# Patient Record
Sex: Male | Born: 1945 | Race: White | Hispanic: No | Marital: Single | Smoking: Former smoker
Health system: Southern US, Community
[De-identification: ages and names within clinical notes are randomized; demographics above are authoritative.]

## PROBLEM LIST (undated history)

## (undated) DIAGNOSIS — E538 Deficiency of other specified B group vitamins: Secondary | ICD-10-CM

## (undated) DIAGNOSIS — K219 Gastro-esophageal reflux disease without esophagitis: Secondary | ICD-10-CM

## (undated) DIAGNOSIS — R112 Nausea with vomiting, unspecified: Secondary | ICD-10-CM

## (undated) DIAGNOSIS — E559 Vitamin D deficiency, unspecified: Secondary | ICD-10-CM

## (undated) DIAGNOSIS — M720 Palmar fascial fibromatosis [Dupuytren]: Secondary | ICD-10-CM

## (undated) DIAGNOSIS — I10 Essential (primary) hypertension: Secondary | ICD-10-CM

## (undated) DIAGNOSIS — E785 Hyperlipidemia, unspecified: Secondary | ICD-10-CM

## (undated) DIAGNOSIS — R748 Abnormal levels of other serum enzymes: Secondary | ICD-10-CM

## (undated) DIAGNOSIS — R002 Palpitations: Secondary | ICD-10-CM

## (undated) DIAGNOSIS — E119 Type 2 diabetes mellitus without complications: Secondary | ICD-10-CM

## (undated) DIAGNOSIS — N400 Enlarged prostate without lower urinary tract symptoms: Secondary | ICD-10-CM

## (undated) DIAGNOSIS — Z9889 Other specified postprocedural states: Secondary | ICD-10-CM

## (undated) DIAGNOSIS — I491 Atrial premature depolarization: Secondary | ICD-10-CM

## (undated) HISTORY — PX: REVISION TOTAL KNEE ARTHROPLASTY: SUR1280

## (undated) HISTORY — PX: TOTAL KNEE ARTHROPLASTY: SHX125

## (undated) HISTORY — PX: CHOLECYSTECTOMY: SHX55

## (undated) HISTORY — PX: INGUINAL HERNIA REPAIR: SUR1180

## (undated) HISTORY — PX: SKIN LESION EXCISION: SHX2412

---

## 2004-09-03 ENCOUNTER — Inpatient Hospital Stay (HOSPITAL_COMMUNITY): Admission: RE | Admit: 2004-09-03 | Discharge: 2004-09-06 | Payer: Self-pay | Admitting: Orthopedic Surgery

## 2004-10-24 ENCOUNTER — Ambulatory Visit (HOSPITAL_BASED_OUTPATIENT_CLINIC_OR_DEPARTMENT_OTHER): Admission: RE | Admit: 2004-10-24 | Discharge: 2004-10-24 | Payer: Self-pay | Admitting: Orthopedic Surgery

## 2004-10-24 ENCOUNTER — Ambulatory Visit (HOSPITAL_COMMUNITY): Admission: RE | Admit: 2004-10-24 | Discharge: 2004-10-24 | Payer: Self-pay | Admitting: Orthopedic Surgery

## 2009-03-16 ENCOUNTER — Ambulatory Visit (HOSPITAL_COMMUNITY): Admission: RE | Admit: 2009-03-16 | Discharge: 2009-03-16 | Payer: Self-pay | Admitting: Orthopedic Surgery

## 2010-03-03 ENCOUNTER — Encounter: Payer: Self-pay | Admitting: Orthopedic Surgery

## 2010-06-28 NOTE — Discharge Summary (Signed)
Nicholas Quinn, Nicholas Quinn            ACCOUNT NO.:  [id number]   MEDICAL RECORD NO.:  [id number]          PATIENT TYPE:  INP   LOCATION:  5018                         FACILITY:  [facility]   PHYSICIAN:  Doralee Albino. Carola Frost, M.D. 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   DISCHARGE DIAGNOSIS:  Status post left total knee arthroscopy.   CONSULTATIONS:  None.   PROCEDURE:  The procedure performed on September 03, 2004, is a left total knee  arthroplasty performed by Dr. Doralee Albino. Handy and Dr. Elana Alm. Wainer  without complications.   HISTORY OF PRESENT ILLNESS:  This is a 65 year old white male with a  longstanding history of severe left knee degenerative joint disease who  wishes to proceed with a left knee arthroplasty to relieve his pain and  deformity at this time.   PHYSICAL EXAMINATION:  GENERAL:  A well-appearing, well-developed 59-year-  old white male, in no acute distress.  HEENT:  Pupils equal and reactive to light and accommodation.  Extraocular  movements intact.  Head normocephalic and atraumatic.  NECK:  Supple.  CHEST:  Lungs are clear to auscultation bilaterally.  HEART:  A regular rate and rhythm.  ABDOMEN:  Positive bowel sounds, nontender and soft.  SKIN:  Warm and dry without rashes.  EXTREMITIES:  Distal neurovascular exam is intact bilaterally in the lower  and upper extremities.  He does have some severe varus deformity.  On x-rays  there is severe left knee degenerative joint disease with a varus deformity.   LABORATORY DATA:  On admission white blood cells 7.9, hemoglobin 15.3,  hematocrit 44.9.  Sodium 135, potassium 3.8, chloride 106, CO2 of 25,  glucose 121, BUN 10, creatinine 0.8.   HOSPITAL COURSE:  On September 03, 2004, the patient underwent a left knee  arthroplasty without complications by Dr. Thurston Hole and Dr. Carola Frost.  On September 04, 2004, on the following day, the patient is seen.  The H&H is 12.9 and 36.7.  Other labs are stable.  At this time the patient was put on Coumadin per  pharmacy protocol to help prevent any deep venous thrombosis.  At this time  the patient is lying in bed, in no acute distress.  He is afebrile.  His  vital signs are stable.  His calves are soft and nontender.  His  neurovascular exam remains intact.   The patient will be weightbearing as tolerated.  We are hoping to increase  his range of motion in his left knee.  On September 05, 2004, the patient is  seen.  Once again the patient's chief complaint is nausea.  The pain is well-  controlled.  The T-Max of 100.1 degrees the previous night.  The incision is  well-approximated without erythema.  His dressing did have some  serosanguineous fluid on it.  The calves remain soft and nontender.  The  dressing is replaced.  Today we will discontinue the  PCA and the Foley.   DISPOSITION:  We will plan for discharge in the morning.  On September 06, 2004,  the patient is seen once again.  The patient reports that the nausea is much  better.  It has subsequently gone.  Labs remain stable.  The patient has no  questions or concerns at this time and would like to be discharged to home.  We are happy to accommodate him in this endeavor.   FOLLOWUP:  He is to follow up with Dr. Carola Frost on Wednesday.  Please call [phone] for an appointment.   DISCHARGE INSTRUCTIONS:  He will be given Percocet for pain, Robaxin for  muscle spasms, Phenergan for nausea, Coumadin for deep venous thrombosis  prophylaxis, and he is given a prescription for a 3 in 1 commode to assist  him in his home.  The patient is instructed to keep his left knee incision  clean and dry at this time.       RWC/MEDQ  D:  09/06/2004  T:  09/06/2004  Job:  [id number]

## 2010-06-28 NOTE — Op Note (Signed)
NAME:  LAQUINTON, BIHM            ACCOUNT NO.:  [id number]   MEDICAL RECORD NO.:  [id number]          PATIENT TYPE:  AMB   LOCATION:  DSC                          FACILITY:  [facility]   PHYSICIAN:  Loreta Ave, M.D. 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   OPERATIVE PROCEDURE:  Left knee exam and manipulation under anesthesia,  intraarticular injection.   SURGEON:  Loreta Ave, M.D.   ANESTHESIA:  General.   PROCEDURE:  The patient was brought to the operating room, and after I had  adequate muscle relaxation and anesthesia, the left knee was examined.  Relatively firm 10-12 degree flexion contracture.  Further flexion to 90  degrees.  Some decreased patellofemoral mobility, but good alignment and  stability.  He was gently manipulated to gain both flexion and extension.  With capsular stretching and breaking of adhesions without adverse  occurrence, I was able to achieve full extension and flexion to 120 degrees.  Maintained good stability and alignment.  I felt this was enough improvement  that intervention arthroscopically or open was not indicated.  Under sterile  technique, he was injected intraarticularly with Marcaine after I aspirated  clear fluid.  Knee immobilizer applied.  Anesthesia reversed.  Brought to  recovery room.  Tolerated surgery well.  No complications.      Loreta Ave, M.D.  Electronically Signed     DFM/MEDQ  D:  10/24/2004  T:  10/24/2004  Job:  [id number]

## 2010-06-28 NOTE — Op Note (Signed)
NAMEUZIAH, SORTER            ACCOUNT NO.:  [id number]   MEDICAL RECORD NO.:  [id number]          PATIENT TYPE:  INP   LOCATION:  2550                         FACILITY:  [facility]   PHYSICIAN:  Doralee Albino. Carola Frost, M.D. DATE OF BIRTH:  Sep 28, 1945   DATE OF PROCEDURE:  09/03/2004  DATE OF DISCHARGE:                                 OPERATIVE REPORT   PREOPERATIVE DIAGNOSIS:  Left knee end-stage degenerative arthritis.   POSTOPERATIVE DIAGNOSIS:  Left knee end-stage degenerative arthritis.   PROCEDURE:  Left total knee arthroplasty using DePuy RPS size 4 femur, #5  tibia, 38-mm patella and 10 mm insert.   SURGEON:  Doralee Albino. Carola Frost, M.D.   ASSISTANTS:  1.  Elana Alm. Thurston Hole, M.D.  2.  Cecil Cranker, PA.   ANESTHESIA:  General supplemented with femoral nerve block.   DRAINS:  Two.   TOTAL TOURNIQUET TIME:  Approximately 1 hour 15 minutes.   EBL: 100cc   DISPOSITION:  To PACU.   CONDITION:  Stable.   BRIEF SUMMARY OF INDICATION FOR PROCEDURE:  Hicks Feick is a 65-year-  old white male who has had a longstanding history of left knee pain and  deformity which has been progressive and refractory to conservative measures  including therapy, injections and arthroscopic debridement. After thorough  discussion of risks and benefits of surgery, he wished to proceed.   BRIEF DESCRIPTION OF PROCEDURE:  Mr. Colee was administered preoperative  antibiotics, taken to the operating room were general anesthesia was induced  and he was positioned supine with his left lower extremity being prepped in  the usual sterile fashion after application of a tourniquet about the thigh.  Following exsanguination of the extremity with an Esmarch bandage, the  tourniquet was inflated to 300 mmHg.  A standard midline incision was made  and dissection carried down to the quadriceps tendon.  The tendinous portion  of the quadriceps was clearly identified and then with a fresh blade,  incised using a typical medial parapatellar arthrotomy. The medial aspect of  the proximal tibia was then elevated with the Bovie.  A partial excision was  then performed of the fat pad and the patella everted without difficulty.  Some a large osteophytes were removed with the rongeur and then the knee was  brought into flexion. The patella, femoral and tibial surfaces were  essentially completely eburnated bone.  The medial compartment had sclerotic  bone and significant wear. Osteophytes were removed from the femur. The ACL  debrided and the distal femoral cutting block placed after partial  synovectomy to clearly visualize the anterior surface of the distal femur.  It was sized to a #4 femur and a 13 mm distal cut made because of the  extensive flexion contracture the cutting block was then placed in the shaft  and posterior and anterior cuts made. Further debridement was performed.  We  were careful to protect the collaterals at all time with  so called funky  retractors. The remaining medial meniscus was then debrided.  The attentions  were then turned to the tibia where  a proximal tibia cut was made  using the  intramedullary guide. We also used the alignment rod to check for  appropriate varus valgus alignment. It lined with the second ray.  The  proximal cut was made and finished under direct visualization posteriorly.   The flexion gap and extension gaps were then assessed and both noted to be  too tight to accommodate the smallest poly. Consequently, the tibia cut was  refined and four additional millimeters of bone resected.  The flexion and  extension gaps were then well-balanced and once again the alignment rod  showed appropriate and symmetric cuts. The soft tissues were balanced as  well, using a partial release of the medial collateral and also external  rotation of the femoral component as well as debridement of the posterior  aspect of the capsule with removal of  osteophytes and partial elevation of  the capsule along the posterior medial aspect of the femur. The tibia was  then trialed and #5 tray shown to have the most coverage.  The proximal  tibia was repaired with intramedullary reamer and wing impactor.  The trial  components consisting of #4 femur, 10 mm poly and #5 tibia were then  inserted.  The knee was noted to be stable in full extension to varus and  valgus stress. It came easily into full extension. The trial components were  then removed. Pulsatile lavage used to wash the then the knee brought into  flexion where the tibial component was inserted, cemented into place.  The  femoral component was inserted and then finally the tibial insert. Axial  load was performed. Additional cement removed.  Lavage was performed once  more and after the cement cured, additional small particles were removed.  The patella was cut using the patellar jig with resection of 9 mm of bone.  It was sized to a 38 which was placed along the medial aspect of the patella  and cemented held clamped until hardening of the cement. The patella was  allowed to return to its proper position and it tracked very easily without  the need for any formal lateral release. Once again the knee was examined  and found be stable and then the tourniquet deflated where hemostasis was  obtained with electrocautery. The arthrotomy was then closed with figure-of-  eight Ethibond suture, subcu with 2-0 Vicryl and the skin with staples.  Sterile gently compressive dressing and knee immobilizer were applied. The  patient awakened from anesthesia and taken to PACU in stable condition.   PROGNOSIS:  Mr. Meunier will be weightbearing as tolerated. He will be  placed into a CPM device at today with increasing flexion as tolerated. He  will require thromboprophylaxis with Coumadin for the next six weeks.       MHH/MEDQ  D:  09/03/2004  T:  09/03/2004  Job:  [id number]

## 2010-10-31 ENCOUNTER — Encounter (HOSPITAL_COMMUNITY)
Admission: RE | Admit: 2010-10-31 | Discharge: 2010-10-31 | Disposition: A | Discharge status: Home / Self Care | Payer: Medicare Other | Source: Ambulatory Visit | Attending: Orthopedic Surgery | Admitting: Orthopedic Surgery

## 2010-10-31 ENCOUNTER — Other Ambulatory Visit (HOSPITAL_COMMUNITY): Payer: Self-pay | Admitting: Orthopedic Surgery

## 2010-10-31 DIAGNOSIS — Z96659 Presence of unspecified artificial knee joint: Secondary | ICD-10-CM

## 2010-10-31 DIAGNOSIS — T84038A Mechanical loosening of other internal prosthetic joint, initial encounter: Secondary | ICD-10-CM

## 2010-10-31 LAB — URINALYSIS, ROUTINE W REFLEX MICROSCOPIC
Glucose, UA: NEGATIVE mg/dL
Hgb urine dipstick: NEGATIVE
Leukocytes, UA: NEGATIVE
Protein, ur: NEGATIVE mg/dL
Specific Gravity, Urine: 1.018 (ref 1.005–1.030)
Urobilinogen, UA: 1 mg/dL (ref 0.0–1.0)

## 2010-10-31 LAB — BASIC METABOLIC PANEL
Calcium: 10 mg/dL (ref 8.4–10.5)
GFR calc Af Amer: >60 mL/min (ref >60)
GFR calc non Af Amer: >60 mL/min (ref >60)
Potassium: 4.6 mEq/L (ref 3.5–5.1)
Sodium: 141 mEq/L (ref 135–145)

## 2010-10-31 LAB — DIFFERENTIAL
Basophils Relative: 1 % (ref 0–1)
Eosinophils Absolute: 0.4 10*3/uL (ref 0.0–0.7)
Lymphs Abs: 2 10*3/uL (ref 0.7–4.0)
Neutro Abs: 6.7 10*3/uL (ref 1.7–7.7)
Neutrophils Relative %: 65 % (ref 43–77)

## 2010-10-31 LAB — CBC
Hemoglobin: 15.7 g/dL (ref 13.0–17.0)
Platelets: 253 10*3/uL (ref 150–400)
RBC: 5.02 MIL/uL (ref 4.22–5.81)
WBC: 10.3 10*3/uL (ref 4.0–10.5)

## 2010-10-31 LAB — APTT: aPTT: 28 seconds (ref 24–37)

## 2010-10-31 LAB — SURGICAL PCR SCREEN
MRSA, PCR: NEGATIVE
Staphylococcus aureus: NEGATIVE

## 2010-10-31 LAB — PROTIME-INR: Prothrombin Time: 13.5 seconds (ref 11.6–15.2)

## 2010-11-11 ENCOUNTER — Inpatient Hospital Stay (HOSPITAL_COMMUNITY): Payer: Medicare Other

## 2010-11-11 ENCOUNTER — Inpatient Hospital Stay (HOSPITAL_COMMUNITY)
Admission: RE | Admit: 2010-11-11 | Discharge: 2010-11-14 | DRG: 468 | Disposition: A | Discharge status: Skilled Nursing Facility | Payer: Medicare Other | Source: Ambulatory Visit | Attending: Orthopedic Surgery | Admitting: Orthopedic Surgery

## 2010-11-11 DIAGNOSIS — I10 Essential (primary) hypertension: Secondary | ICD-10-CM | POA: Diagnosis present

## 2010-11-11 DIAGNOSIS — Z23 Encounter for immunization: Secondary | ICD-10-CM

## 2010-11-11 DIAGNOSIS — Z01818 Encounter for other preprocedural examination: Secondary | ICD-10-CM

## 2010-11-11 DIAGNOSIS — R112 Nausea with vomiting, unspecified: Secondary | ICD-10-CM | POA: Diagnosis present

## 2010-11-11 DIAGNOSIS — K219 Gastro-esophageal reflux disease without esophagitis: Secondary | ICD-10-CM | POA: Diagnosis present

## 2010-11-11 DIAGNOSIS — E7800 Pure hypercholesterolemia, unspecified: Secondary | ICD-10-CM | POA: Diagnosis present

## 2010-11-11 DIAGNOSIS — M129 Arthropathy, unspecified: Secondary | ICD-10-CM | POA: Diagnosis present

## 2010-11-11 DIAGNOSIS — Z79899 Other long term (current) drug therapy: Secondary | ICD-10-CM

## 2010-11-11 DIAGNOSIS — Y831 Surgical operation with implant of artificial internal device as the cause of abnormal reaction of the patient, or of later complication, without mention of misadventure at the time of the procedure: Secondary | ICD-10-CM | POA: Diagnosis present

## 2010-11-11 DIAGNOSIS — T84498A Other mechanical complication of other internal orthopedic devices, implants and grafts, initial encounter: Principal | ICD-10-CM | POA: Diagnosis present

## 2010-11-11 DIAGNOSIS — Z01812 Encounter for preprocedural laboratory examination: Secondary | ICD-10-CM

## 2010-11-11 DIAGNOSIS — Z01810 Encounter for preprocedural cardiovascular examination: Secondary | ICD-10-CM

## 2010-11-11 LAB — TYPE AND SCREEN: ABO/RH(D): A POS

## 2010-11-11 LAB — GRAM STAIN

## 2010-11-12 ENCOUNTER — Inpatient Hospital Stay (HOSPITAL_COMMUNITY): Payer: Medicare Other

## 2010-11-12 LAB — BODY FLUID CULTURE

## 2010-11-12 LAB — BASIC METABOLIC PANEL
BUN: 14 mg/dL (ref 6–23)
Creatinine, Ser: 0.8 mg/dL (ref 0.50–1.35)
GFR calc non Af Amer: >90 mL/min (ref >90)
Glucose, Bld: 195 mg/dL — ABNORMAL HIGH (ref 70–99)
Potassium: 4.2 mEq/L (ref 3.5–5.1)

## 2010-11-12 LAB — CBC
MCH: 30.4 pg (ref 26.0–34.0)
MCHC: 34.7 g/dL (ref 30.0–36.0)
MCV: 87.5 fL (ref 78.0–100.0)
Platelets: 202 10*3/uL (ref 150–400)
RBC: 3.36 MIL/uL — ABNORMAL LOW (ref 4.22–5.81)

## 2010-11-13 LAB — CBC
HCT: 27 % — ABNORMAL LOW (ref 39.0–52.0)
Hemoglobin: 9.2 g/dL — ABNORMAL LOW (ref 13.0–17.0)
MCH: 30.4 pg (ref 26.0–34.0)
MCHC: 34.1 g/dL (ref 30.0–36.0)

## 2010-11-13 LAB — PROTIME-INR: Prothrombin Time: 19.8 seconds — ABNORMAL HIGH (ref 11.6–15.2)

## 2010-11-14 LAB — CBC
Platelets: 171 10*3/uL (ref 150–400)
RBC: 3.08 MIL/uL — ABNORMAL LOW (ref 4.22–5.81)
WBC: 10.8 10*3/uL — ABNORMAL HIGH (ref 4.0–10.5)

## 2010-11-14 LAB — PROTIME-INR: Prothrombin Time: 20.2 seconds — ABNORMAL HIGH (ref 11.6–15.2)

## 2010-11-16 LAB — ANAEROBIC CULTURE

## 2010-11-16 NOTE — Op Note (Signed)
NAMEMONTERRIO, GERST NO.:  [id number]  MEDICAL RECORD NO.:  [id number]  LOCATION:  5013                         FACILITY:  [facility]  PHYSICIAN:  Feliberto Gottron. Turner Daniels, M.D.   DATE OF BIRTH:  01/21/1946  DATE OF PROCEDURE:  11/11/2010 DATE OF DISCHARGE:                              OPERATIVE REPORT   PREOPERATIVE DIAGNOSIS:  Loose left total knee.  POSTOPERATIVE DIAGNOSIS:  Loose left total knee.  PROCEDURE:  Revision left total knee arthroplasty with removal of a DePuy sigma RP design with a 4 femur and a 5 tibia and revision to a 5 femur stems with a 31-mm cone and a 14 x 75 stem on the tibial side, a 6 six tibia with a 12 x 75 stem.  We used a 12 mm MBT bearing #5.  SURGEON:  Feliberto Gottron.  Turner Daniels, MD  FIRST ASSISTANT:  Shirl Harris, PA-C  ANESTHETIC:  General endotracheal.  ESTIMATED BLOOD LOSS:  600 mL.  FLUID REPLACEMENT:  1500 mL of crystalloid.  DRAINS PLACED:  Foley catheter, 2 medium Hemovac.  TOURNIQUET TIME:  45 minutes.  INDICATIONS FOR PROCEDURE:  A 65 year old gentleman who had fractures of his left lower extremity back in 2007, underwent a primary total knee after the fractures healed using DePuy sigma RP design.  He has had progressive loosening over the last year or so confirmed by bone scan. He has had sterile effusions that grew out nothing on aspiration.  On the lateral of the femoral component, you can clearly see where the metal is being laminated off the cement and stepped forward and on the tibial side there are large cysts beneath the tibial component with either loosening or near loosening.  The patellar component appears to be well-fixed.  In any event because of severe unremitting pain, it is progressive over the last year or so.  He desires elective revision of his loose left total knee arthroplasty.  Risks and benefits of surgery have been discussed, questions answered.  DESCRIPTION OF PROCEDURE:  The patient identified by  armband, received preoperative IV antibiotics in the holding area at [facility], taken to operating room #5, appropriate anesthetic monitors were attached and endotracheal anesthesia was induced with the patient supine position.  Foley catheter was inserted lateral post and foot positioner applied to the table.  Left lower extremity prepped and draped in usual sterile fashion from the ankle to the midthigh.  A tourniquet was placed high on the midthigh prior to the prep and drape.  Time-out procedure was performed and we began the operation with the tourniquet down.  We recreated the old anterior midline incision starting a handbreadth above the patella, going over the patella, and 1 cm medial to and 4 cm distal to the tibial tubercle.  Small bleeders and skin and subcutaneous tissue identified and cauterized.  Transverse retinaculum identified and reflected medially exposing the line of Ethibond sutures that were used for the original closure.  We cut through this line.  There was a fair amount of heterotopic bone that we had to work around and a medial parapatellar arthrotomy was accomplished.  Clear joint fluid was expressed.  A bit of synovium was sent to  the lab for Gram stain and culture and came back with occasional monos, no polys, no organisms.  We then sent about removing large amounts of exuberant synovium.  There was some obvious bits of cement in the synovium.  We sequentially worked around the superficial medial collateral ligament elevating it from anterior to posterior off the proximal tibia allowing [place] to externally rotate the tibia as we flexed it.  Scar tissue was also removed from around the patellar tendon medially and laterally and we continued external rotation and subluxed the patella laterally.  As we began to work our way around, we were able to spin the bearing out and remove it. At this point, it was obvious that the femoral component was grossly loose  and pretty much came off with hand pressure.  There were large defects in the medial femoral condyle and these were debrided.  A fibrous tissue back down to bone did appear to be healthy.  On the lateral side, there was actually distal cement that was still well-fixed to the bone and this was left alone to set leg length.  At this point, we were able to evert the patella with the knee hyperflexed and worked our way around the tibial component, broke down the interface between the bone and cement with a small ACL saw and removed the tibial component without difficulty.  There was a large cyst underneath the medial side and scar tissue and fibrous tissue was removed from this as well.  The cement plug from the primary procedure was also removed using the [facility] instruments.  At this point, we were able to instrument the proximal tibia, we decided to use a 75-mm stem and sequentially reamed up to a 12-mm reamer to the appropriate depth for a 75 stem.  We then set the cutting guide in place with pins and removed about 1 mm of bone and cement from the tibia that only had the cavitary defect on the medial tibial plateau and otherwise the rim was in pretty good shape. We continued to remove scar tissue from the posterior condyles of the femur at this time as well.  We then directed our attention to the femur and removed cement from the notch region and instrumented the femur with the reamers up to a 14-mm reamer to the appropriate depth for a small cone and the 75 stem.  The broach was then inserted after first removing sclerotic bone with a bur to the appropriate depth for a 14 mm x 75 stem and a 31-mm femoral cone.  This was set at the same depth as the distal femur on the lateral side which had a normal appearance.  With the broach in place, we then checked to see if we needed to make any more bony cuts anteriorly or posteriorly by setting the cutting guide in place in the same rotation as  the epicondylar axis and no bony cut was needed, same with the chamfer cutting guide.  We did cut the notch.  We then directed our attention back to the tibia, sized for a #6 tibial baseplate.  We removed 5, pinned it in place, did the conical reaming without and with the stem and then the Delta fin keel punch.  We assembled a trial femoral component on the back table with a 5 left TC 3 femur, 31 mm cone, and a 14 x 75 stem.  This was hammered into place and noted to have good fit and fill, especially along the conical broach  region.  We then performed trial reductions with 10 and 12.5 mm bearings.  The 12.5 came to full extension, had good ligamentous stability, and the patella which was not loose was noted to track without difficulty.  At this point, all trial components were removed. A triple batch of DePuy HV cement with 1500 mg of Zinacef was then mixed and applied to all bony and metallic mating surfaces.  We did not actually cut the stem, but we did cut the conical regions of the tibia and the femur.  We then hammered into place a 6 tibial baseplate with a 12 x 75 stem and removed excess cement on the femoral side.  It was a 5 left femur, a 31 mm module, 2 mm translated posteriorly femur on the module, and a 14 x 75 stem.  The 12.5 bearing was inserted TC3 model. The knee was held in full extension and excess cement was once again removed.  We then checked our patellar tracking.  No thumb pressure was required.  Medium Hemovac drains were placed from the anterolateral approach and at this point the wound was irrigated out one more time with normal saline pulse lavage.  The parapatellar arthrotomy was closed with running #1 Vicryl suture, the subcutaneous tissue with 0-0 and 2-0 undyed Vicryl suture, and the skin with skin staples.  A dressing of Xeroform, 4x4 dressing, sponges, Webril, and Ace wrap applied.  The tourniquet was let down.  The patient was awakened, extubated, and  taken to the recovery room without difficulty.     Feliberto Gottron. Turner Daniels, M.D.     Ovid Curd  D:  11/11/2010  T:  11/11/2010  Job:  [id number]  Electronically Signed by Gean Birchwood M.D. on 11/16/2010 04:54:09 PM

## 2010-11-21 NOTE — Discharge Summary (Signed)
  Nicholas Quinn, Nicholas Quinn NO.:  [id number]  MEDICAL RECORD NO.:  [id number]  LOCATION:  5013                         FACILITY:  [facility]  PHYSICIAN:  Feliberto Gottron. Turner Daniels, M.D.   DATE OF BIRTH:  04/06/1945  DATE OF ADMISSION:  11/11/2010 DATE OF DISCHARGE:  11/14/2010                              DISCHARGE SUMMARY   CHIEF COMPLAINT:  Loose left total knee.  HISTORY OF PRESENT ILLNESS:  This is a 65 year old gentleman who complains of gradually increasing pain in his left total knee.  X-rays demonstrate evidence of loosening of the femoral component.  He desires a surgical revision.  All risks and benefits of surgery were discussed with the patient's.  PAST MEDICAL HISTORY:  Significant for high cholesterol.  PAST SURGICAL HISTORY:  Significant for cholecystectomy, hernia repair, left total knee arthroplasty and closed manipulation.  ALLERGIES:  He has an allergy to CODEINE.  SOCIAL HISTORY:  He denies use or tobacco or alcohol.  FAMILY HISTORY:  Noncontributory.  PHYSICAL EXAMINATION:  Gross examination of the left knee demonstrates 2+ effusion.  Range of motion is 5-90 degrees.  He is neurovascularly intact.  X-rays and a bone scan demonstrate loosening of the femoral component.  PREOP LABS:  White blood cells 8.6, red blood cells 5.01, hemoglobin 15.3, hematocrit 44.3, platelets 244.  HOSPITAL COURSE:  Nicholas Quinn on November 11, 2010, when he underwent revision of the femoral and tibial component of his left total knee.  The procedure was performed by Dr. Gean Birchwood and the patient tolerated it well.  Two Hemovac drains were placed into the knee.  A perioperative Foley catheter was also placed.  He was transferred to the floor on Lovenox and Coumadin for DVT prophylaxis. On the first postoperative day, he was awake and alert, but complained of nausea and vomiting.  He seemed to have decreased bowel sounds, so a KUB was ordered that  showed no evidence of ileus or obstruction.  The following day, Nicholas Quinn that his symptoms had significantly improved.  His hemoglobin was 9.2 and he was making good progress with physical therapy.  His surgical dressing was changed and his incision was found to be benign.  On postoperative day #3, his hemoglobin was 9.3.  His dressing remained clean.  He was doing well with physical therapy and was discharged to a skilled nursing facility for rehab because he lives alone.  DISPOSITION:  The patient was discharged to skilled nursing on November 14, 2010, he was weightbearing as tolerated and would be on Coumadin for a total of 2 weeks.  His target INR is 1.5-2.0 and this will be managed by the rehab facility.  His followup with Dr. Turner Daniels will be on postoperative day #14 for x-rays and staple removal.  FINAL DIAGNOSIS:  Loose left total knee arthroplasty.     Shirl Harris, PA   ______________________________ Feliberto Gottron. Turner Daniels, M.D.    JW/MEDQ  D:  11/14/2010  T:  11/14/2010  Job:  [id number]  Electronically Signed by Shirl Harris PA on 11/19/2010 08:33:26 AM Electronically Signed by Gean Birchwood M.D. on 11/21/2010 10:09:10 PM

## 2011-12-24 IMAGING — CR DG ABDOMEN 1V
2 series · 2 of 2 positions shown · non-contrast
Comparison: None.

CLINICAL DATA: Abdominal pain, ileus

ABDOMEN - 1 VIEW

[t abdomen supine * (1 of 2)]
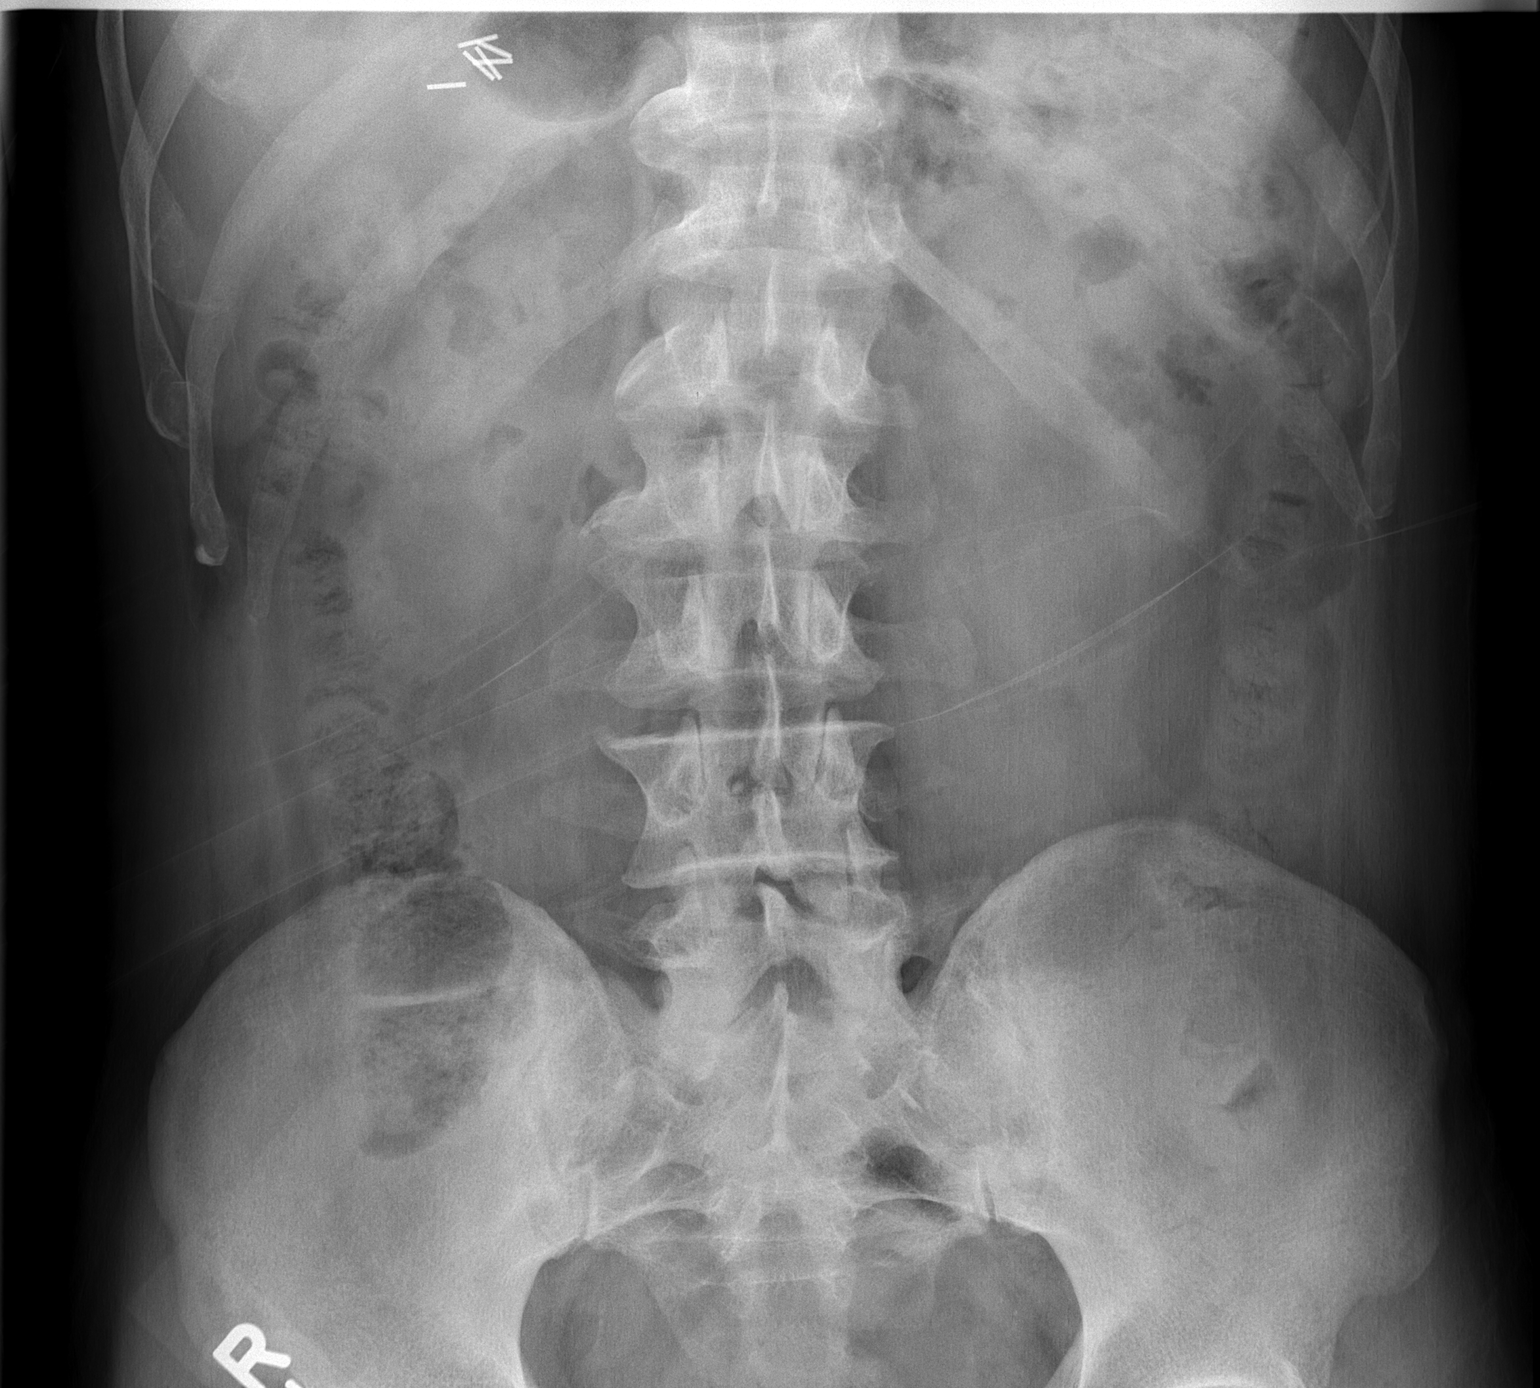

[t abdomen supine * (2 of 2)]
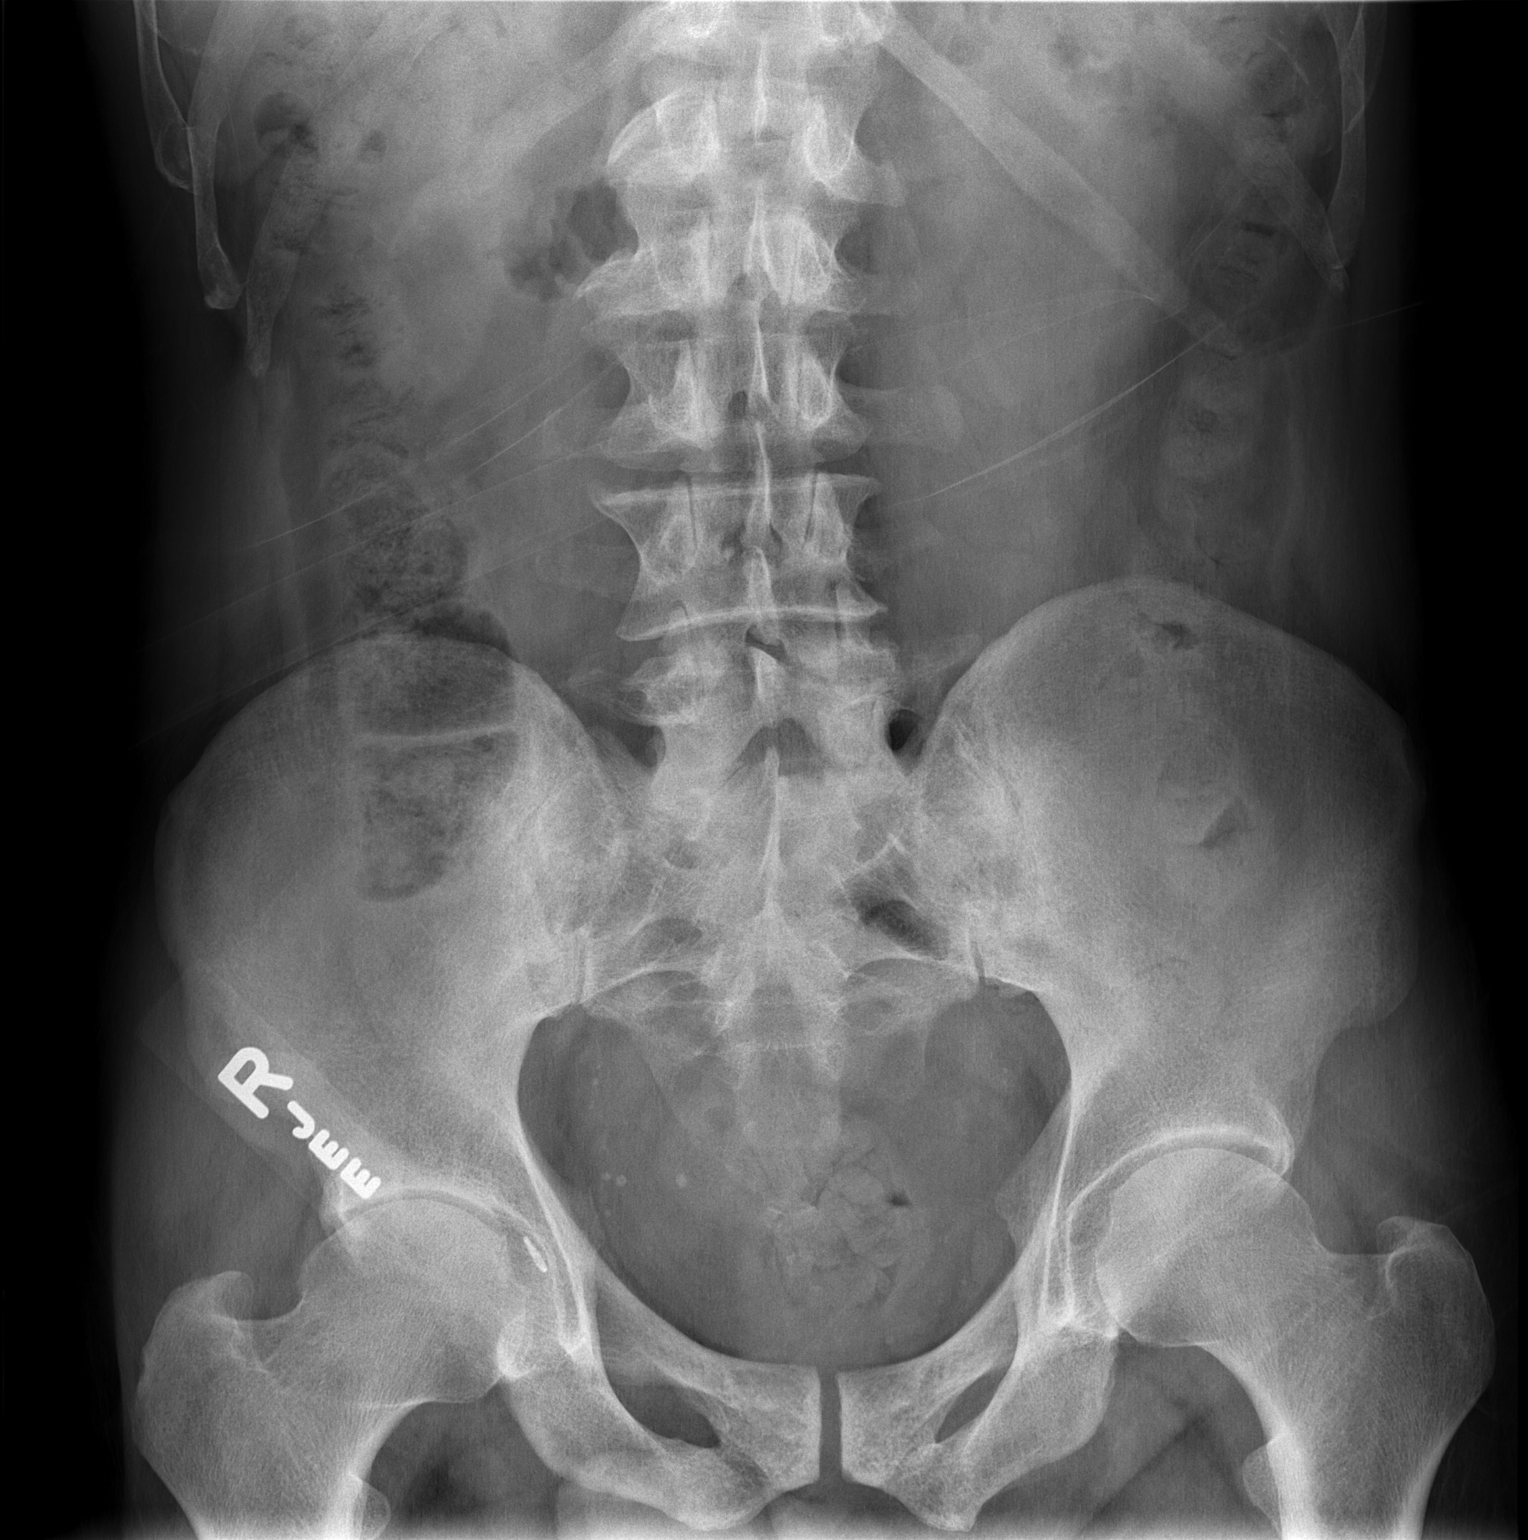

[2 of 2 positions shown; findings below may reference images not displayed]

FINDINGS: The stool and bowel gas pattern is within normal limits
with no evidence of obstruction or pneumoperitoneum.  There is no
gross organomegaly.  No abnormal calcifications overlie the abdomen
or pelvis.  Hypertrophic degenerative changes are present within
the axial spine.  Surgical clips overlie the gallbladder fossa.
IMPRESSION: Normal stool and bowel gas pattern.

## 2020-12-04 ENCOUNTER — Other Ambulatory Visit: Payer: Self-pay | Admitting: Neurosurgery

## 2020-12-13 ENCOUNTER — Other Ambulatory Visit: Payer: Self-pay | Admitting: Neurosurgery

## 2020-12-24 NOTE — Pre-Procedure Instructions (Signed)
Surgical Instructions    Your procedure is scheduled on Friday, 12/28/20.  Report to [facility] Main Entrance "A" at 05:30 A.M., then check in with the Admitting office.  Call this number if you have problems the morning of surgery:  [phone]   If you have any questions prior to your surgery date call [phone]: Open Monday-Friday 8am-4pm    Remember:  Do not eat or drink after midnight the night before your surgery   Take these medicines the morning of surgery with A SIP OF WATER:  atorvastatin (LIPITOR) gabapentin (NEURONTIN) omeprazole (PRILOSEC) RESTASIS 0.05 % ophthalmic emulsion   Take these medicines the morning of surgery with A SIP OF WATER AS NEEDED:  acetaminophen (TYLENOL) tiZANidine (ZANAFLEX)   As of today, STOP taking any meloxicam (MOBIC), Aspirin (unless otherwise instructed by your surgeon) Aleve, Naproxen, Ibuprofen, Motrin, Advil, Goody's, BC's, all herbal medications, fish oil, and all vitamins.    WHAT DO I DO ABOUT MY DIABETES MEDICATION?  Do not take metFORMIN (GLUCOPHAGE-XR) the morning of surgery.   HOW TO MANAGE YOUR DIABETES BEFORE AND AFTER SURGERY  Why is it important to control my blood sugar before and after surgery? Improving blood sugar levels before and after surgery helps healing and can limit problems. A way of improving blood sugar control is eating a healthy diet by:  Eating less sugar and carbohydrates  Increasing activity/exercise  Talking with your doctor about reaching your blood sugar goals High blood sugars (greater than 180 mg/dL) can raise your risk of infections and slow your recovery, so you will need to focus on controlling your diabetes during the weeks before surgery. Make sure that the doctor who takes care of your diabetes knows about your planned surgery including the date and location.  How do I manage my blood sugar before surgery? Check your blood sugar at least 4 times a day, starting 2 days before  surgery, to make sure that the level is not too high or low.  Check your blood sugar the morning of your surgery when you wake up and every 2 hours until you get to the Short Stay unit.  If your blood sugar is less than 70 mg/dL, you will need to treat for low blood sugar: Treat a low blood sugar (less than 70 mg/dL) with  cup of clear juice (cranberry or apple), 4 glucose tablets, OR glucose gel. Recheck blood sugar in 15 minutes after treatment (to make sure it is greater than 70 mg/dL). If your blood sugar is not greater than 70 mg/dL on recheck, call [phone] for further instructions. Report your blood sugar to the short stay nurse when you get to Short Stay.  If you are admitted to the hospital after surgery: Your blood sugar will be checked by the staff and you will probably be given insulin after surgery (instead of oral diabetes medicines) to make sure you have good blood sugar levels. The goal for blood sugar control after surgery is 80-180 mg/dL.    After your COVID test   You are not required to quarantine however you are required to wear a well-fitting mask when you are out and around people not in your household.  If your mask becomes wet or soiled, replace with a new one.  Wash your hands often with soap and water for 20 seconds or clean your hands with an alcohol-based hand sanitizer that contains at least 60% alcohol.  Do not share personal items.  Notify your provider: if you are  in close contact with someone who has COVID  or if you develop a fever of 100.4 or greater, sneezing, cough, sore throat, shortness of breath or body aches.           Do not wear jewelry Do not wear lotions, powders, colognes, or deodorant. Men may shave face and neck. Do not bring valuables to the hospital.             [facility] is not responsible for any belongings or valuables.  Do NOT Smoke (Tobacco/Vaping)  24 hours prior to your procedure  If you use a CPAP at night, you may  bring your mask for your overnight stay.   Contacts, glasses, hearing aids, dentures or partials may not be worn into surgery, please bring cases for these belongings   For patients admitted to the hospital, discharge time will be determined by your treatment team.   Patients discharged the day of surgery will not be allowed to drive home, and someone needs to stay with them for 24 hours.  NO VISITORS WILL BE ALLOWED IN PRE-OP WHERE PATIENTS ARE PREPPED FOR SURGERY.  ONLY 1 SUPPORT PERSON MAY BE PRESENT IN THE WAITING ROOM WHILE YOU ARE IN SURGERY.  IF YOU ARE TO BE ADMITTED, ONCE YOU ARE IN YOUR ROOM YOU WILL BE ALLOWED TWO (2) VISITORS. 1 (ONE) VISITOR MAY STAY OVERNIGHT BUT MUST ARRIVE TO THE ROOM BY 8pm.  Minor children may have two parents present. Special consideration for safety and communication needs will be reviewed on a case by case basis.  Special instructions:    Oral Hygiene is also important to reduce your risk of infection.  Remember - BRUSH YOUR TEETH THE MORNING OF SURGERY WITH YOUR REGULAR TOOTHPASTE   [place]- Preparing For Surgery  Before surgery, you can play an important role. Because skin is not sterile, your skin needs to be as free of germs as possible. You can reduce the number of germs on your skin by washing with CHG (chlorahexidine gluconate) Soap before surgery.  CHG is an antiseptic cleaner which kills germs and bonds with the skin to continue killing germs even after washing.     Please do not use if you have an allergy to CHG or antibacterial soaps. If your skin becomes reddened/irritated stop using the CHG.  Do not shave (including legs and underarms) for at least 48 hours prior to first CHG shower. It is OK to shave your face.  Please follow these instructions carefully.     Shower the NIGHT BEFORE SURGERY and the MORNING OF SURGERY with CHG Soap.   If you chose to wash your hair, wash your hair first as usual with your normal shampoo. After you  shampoo, rinse your hair and body thoroughly to remove the shampoo.  Then [employer] and genitals (private parts) with your normal soap and rinse thoroughly to remove soap.  After that Use CHG Soap as you would any other liquid soap. You can apply CHG directly to the skin and wash gently with a scrungie or a clean washcloth.   Apply the CHG Soap to your body ONLY FROM THE NECK DOWN.  Do not use on open wounds or open sores. Avoid contact with your eyes, ears, mouth and genitals (private parts). Wash Face and genitals (private parts)  with your normal soap.   Wash thoroughly, paying special attention to the area where your surgery will be performed.  Thoroughly rinse your body with warm water from the  neck down.  DO NOT shower/wash with your normal soap after using and rinsing off the CHG Soap.  Pat yourself dry with a CLEAN TOWEL.  Wear CLEAN PAJAMAS to bed the night before surgery  Place CLEAN SHEETS on your bed the night before your surgery  DO NOT SLEEP WITH PETS.   Day of Surgery:  Take a shower with CHG soap. Wear Clean/Comfortable clothing the morning of surgery Do not apply any deodorants/lotions.   Remember to brush your teeth WITH YOUR REGULAR TOOTHPASTE.   Please read over the following fact sheets that you were given.

## 2020-12-25 ENCOUNTER — Other Ambulatory Visit (HOSPITAL_COMMUNITY): Payer: Self-pay

## 2020-12-25 ENCOUNTER — Other Ambulatory Visit: Payer: Self-pay

## 2020-12-25 ENCOUNTER — Encounter (HOSPITAL_COMMUNITY)
Admission: RE | Admit: 2020-12-25 | Discharge: 2020-12-25 | Disposition: A | Discharge status: Home / Self Care | Payer: Medicare Other | Source: Ambulatory Visit | Attending: Neurosurgery | Admitting: Neurosurgery

## 2020-12-25 ENCOUNTER — Encounter (HOSPITAL_COMMUNITY): Payer: Self-pay

## 2020-12-25 VITALS — BP 155/99 | HR 98 | Temp 97.8°F | Resp 17 | Ht 70.0 in | Wt 145.0 lb

## 2020-12-25 DIAGNOSIS — I10 Essential (primary) hypertension: Secondary | ICD-10-CM | POA: Insufficient documentation

## 2020-12-25 DIAGNOSIS — E119 Type 2 diabetes mellitus without complications: Secondary | ICD-10-CM | POA: Insufficient documentation

## 2020-12-25 DIAGNOSIS — Z01818 Encounter for other preprocedural examination: Secondary | ICD-10-CM

## 2020-12-25 DIAGNOSIS — E785 Hyperlipidemia, unspecified: Secondary | ICD-10-CM | POA: Insufficient documentation

## 2020-12-25 DIAGNOSIS — Z20822 Contact with and (suspected) exposure to covid-19: Secondary | ICD-10-CM | POA: Insufficient documentation

## 2020-12-25 DIAGNOSIS — K219 Gastro-esophageal reflux disease without esophagitis: Secondary | ICD-10-CM | POA: Insufficient documentation

## 2020-12-25 HISTORY — DX: Essential (primary) hypertension: I10

## 2020-12-25 HISTORY — DX: Atrial premature depolarization: I49.1

## 2020-12-25 HISTORY — DX: Type 2 diabetes mellitus without complications: E11.9

## 2020-12-25 HISTORY — DX: Palmar fascial fibromatosis (dupuytren): M72.0

## 2020-12-25 HISTORY — DX: Other specified postprocedural states: Z98.890

## 2020-12-25 HISTORY — DX: Abnormal levels of other serum enzymes: R74.8

## 2020-12-25 HISTORY — DX: Hyperlipidemia, unspecified: E78.5

## 2020-12-25 HISTORY — DX: Gastro-esophageal reflux disease without esophagitis: K21.9

## 2020-12-25 HISTORY — DX: Benign prostatic hyperplasia without lower urinary tract symptoms: N40.0

## 2020-12-25 HISTORY — DX: Deficiency of other specified B group vitamins: E53.8

## 2020-12-25 HISTORY — DX: Palpitations: R00.2

## 2020-12-25 HISTORY — DX: Vitamin D deficiency, unspecified: E55.9

## 2020-12-25 HISTORY — DX: Nausea with vomiting, unspecified: R11.2

## 2020-12-25 LAB — TYPE AND SCREEN
ABO/RH(D): A POS
Antibody Screen: NEGATIVE

## 2020-12-25 LAB — SURGICAL PCR SCREEN
MRSA, PCR: NEGATIVE
Staphylococcus aureus: NEGATIVE

## 2020-12-25 LAB — SARS CORONAVIRUS 2 (TAT 6-24 HRS): SARS Coronavirus 2: NEGATIVE

## 2020-12-25 LAB — GLUCOSE, CAPILLARY: Glucose-Capillary: 153 mg/dL — ABNORMAL HIGH (ref 70–99)

## 2020-12-25 NOTE — Progress Notes (Signed)
PCP - Dr. Wilfred Curtis Cardiologist - pt denies  PPM/ICD - n/a  Chest x-ray - n/a EKG - 12/25/20 in PAT Stress Test -  pt denies ECHO -  pt denies Cardiac Cath -  pt denies  Sleep Study -  pt denies CPAP - n/a  Fasting Blood Sugar - 130 per pt; 153 in PAT Checks Blood Sugar every other day  Blood Thinner Instructions: n/a Aspirin Instructions: As of today, STOP taking any meloxicam (MOBIC), Aspirin (unless otherwise instructed by your surgeon) Aleve, Naproxen, Ibuprofen, Motrin, Advil, Goody's, BC's, all herbal medications, fish oil, and all vitamins  NPO at midnight  COVID TEST- 12/25/20 in PAT  Anesthesia review: yes, abnormal EKG  Patient denies shortness of breath, fever, cough and chest pain at PAT appointment   All instructions explained to the patient, with a verbal understanding of the material. Patient agrees to go over the instructions while at home for a better understanding. Patient also instructed to self quarantine after being tested for COVID-19. The opportunity to ask questions was provided.

## 2020-12-26 ENCOUNTER — Other Ambulatory Visit (HOSPITAL_COMMUNITY): Payer: Self-pay | Admitting: Neurosurgery

## 2020-12-26 ENCOUNTER — Other Ambulatory Visit: Payer: Self-pay | Admitting: Neurosurgery

## 2020-12-26 DIAGNOSIS — M4316 Spondylolisthesis, lumbar region: Secondary | ICD-10-CM

## 2020-12-26 NOTE — Progress Notes (Signed)
Anesthesia Chart Review:  DM2, GERD, HLD, HTN, chronically elevated LFTs followed by PCP Dr. Birdena Crandall at [place].  Last seen 12/17/2020 and discussed upcoming surgery.  CBC at that time with mildly reduced platelet count 145, otherwise unremarkable.  A1c 6.8.  CMP with elevated alk phos at 360, AST 60, ALT 45.  These are improved compared with prior labs on 08/16/2020.  Remainder of labs unremarkable.  EKG 12/25/2020: Sinus rhythm with marked sinus arrhythmia.  Rate 77.  Incomplete right bundle branch block. Minimal voltage criteria for LVH, may be normal variant ( Cornell product ). No significant change since last tracing  Wynonia Musty [facility] Short Stay Center/Anesthesiology Phone [phone] 12/26/2020 1:09 PM

## 2020-12-26 NOTE — Anesthesia Preprocedure Evaluation (Addendum)
Anesthesia Evaluation  Patient identified by MRN, date of birth, ID band Patient awake    Reviewed: Allergy & Precautions, NPO status , Patient's Chart, lab work & pertinent test results  History of Anesthesia Complications (+) PONV and history of anesthetic complications  Airway Mallampati: II  TM Distance: >3 FB Neck ROM: Full    Dental  (+) Dental Advisory Given, Edentulous Upper, Edentulous Lower   Pulmonary neg pulmonary ROS, former smoker,    Pulmonary exam normal breath sounds clear to auscultation       Cardiovascular hypertension,  Rhythm:Regular Rate:Tachycardia     Neuro/Psych negative neurological ROS     GI/Hepatic Neg liver ROS, GERD  ,  Endo/Other  negative endocrine ROSdiabetes  Renal/GU negative Renal ROS     Musculoskeletal negative musculoskeletal ROS (+)   Abdominal   Peds  Hematology negative hematology ROS (+)   Anesthesia Other Findings   Reproductive/Obstetrics                           Anesthesia Physical Anesthesia Plan  ASA: 3  Anesthesia Plan: General   Post-op Pain Management:    Induction: Intravenous  PONV Risk Score and Plan: 4 or greater and Ondansetron, Dexamethasone, Treatment may vary due to age or medical condition and Diphenhydramine  Airway Management Planned: Oral ETT  Additional Equipment: Arterial line  Intra-op Plan:   Post-operative Plan: Extubation in OR  Informed Consent: I have reviewed the patients History and Physical, chart, labs and discussed the procedure including the risks, benefits and alternatives for the proposed anesthesia with the patient or authorized representative who has indicated his/her understanding and acceptance.     Dental advisory given  Plan Discussed with: CRNA  Anesthesia Plan Comments: (PAT note by Karoline Caldwell, PA-C: DM2, GERD, HLD, HTN, chronically elevated LFTs followed by PCP Dr. Birdena Crandall at  [place].  Last seen 12/17/2020 and discussed upcoming surgery.  CBC at that time with mildly reduced platelet count 145, otherwise unremarkable.  A1c 6.8.  CMP with elevated alk phos at 360, AST 60, ALT 45.  These are improved compared with prior labs on 08/16/2020.  Remainder of labs unremarkable.  EKG 12/25/2020: Sinus rhythm with marked sinus arrhythmia.  Rate 77.  Incomplete right bundle branch block. Minimal voltage criteria for LVH, may be normal variant ( Cornell product ). No significant change since last tracing  )     Anesthesia Quick Evaluation

## 2020-12-27 ENCOUNTER — Other Ambulatory Visit: Payer: Self-pay

## 2020-12-27 ENCOUNTER — Inpatient Hospital Stay (HOSPITAL_COMMUNITY)
Admission: RE | Admit: 2020-12-27 | Discharge: 2020-12-27 | Disposition: A | Discharge status: Home / Self Care | Payer: Medicare Other | Source: Ambulatory Visit | Attending: Neurosurgery | Admitting: Neurosurgery

## 2020-12-27 DIAGNOSIS — M4316 Spondylolisthesis, lumbar region: Secondary | ICD-10-CM | POA: Insufficient documentation

## 2020-12-28 ENCOUNTER — Encounter (HOSPITAL_COMMUNITY)
Admission: RE | Disposition: A | Discharge status: Home / Self Care | Payer: Self-pay | Source: Home / Self Care | Attending: Neurosurgery

## 2020-12-28 ENCOUNTER — Inpatient Hospital Stay (HOSPITAL_COMMUNITY): Payer: Medicare Other

## 2020-12-28 ENCOUNTER — Inpatient Hospital Stay (HOSPITAL_COMMUNITY): Payer: Medicare Other | Admitting: Physician Assistant

## 2020-12-28 ENCOUNTER — Inpatient Hospital Stay (HOSPITAL_COMMUNITY): Payer: Medicare Other | Admitting: Anesthesiology

## 2020-12-28 ENCOUNTER — Encounter (HOSPITAL_COMMUNITY): Payer: Self-pay | Admitting: Neurosurgery

## 2020-12-28 ENCOUNTER — Other Ambulatory Visit: Payer: Self-pay

## 2020-12-28 ENCOUNTER — Inpatient Hospital Stay (HOSPITAL_COMMUNITY)
Admission: RE | Admit: 2020-12-28 | Discharge: 2020-12-29 | DRG: 455 | Disposition: A | Discharge status: Home / Self Care | Payer: Medicare Other | Attending: Neurological Surgery | Admitting: Neurological Surgery

## 2020-12-28 DIAGNOSIS — M48062 Spinal stenosis, lumbar region with neurogenic claudication: Principal | ICD-10-CM | POA: Diagnosis present

## 2020-12-28 DIAGNOSIS — M545 Low back pain, unspecified: Secondary | ICD-10-CM | POA: Diagnosis present

## 2020-12-28 DIAGNOSIS — E559 Vitamin D deficiency, unspecified: Secondary | ICD-10-CM | POA: Diagnosis present

## 2020-12-28 DIAGNOSIS — Z884 Allergy status to anesthetic agent status: Secondary | ICD-10-CM

## 2020-12-28 DIAGNOSIS — Z7984 Long term (current) use of oral hypoglycemic drugs: Secondary | ICD-10-CM | POA: Diagnosis not present

## 2020-12-28 DIAGNOSIS — Z79899 Other long term (current) drug therapy: Secondary | ICD-10-CM | POA: Diagnosis not present

## 2020-12-28 DIAGNOSIS — M4156 Other secondary scoliosis, lumbar region: Secondary | ICD-10-CM | POA: Diagnosis present

## 2020-12-28 DIAGNOSIS — Z87891 Personal history of nicotine dependence: Secondary | ICD-10-CM | POA: Diagnosis not present

## 2020-12-28 DIAGNOSIS — E538 Deficiency of other specified B group vitamins: Secondary | ICD-10-CM | POA: Diagnosis present

## 2020-12-28 DIAGNOSIS — Z9049 Acquired absence of other specified parts of digestive tract: Secondary | ICD-10-CM

## 2020-12-28 DIAGNOSIS — E785 Hyperlipidemia, unspecified: Secondary | ICD-10-CM | POA: Diagnosis present

## 2020-12-28 DIAGNOSIS — Z791 Long term (current) use of non-steroidal anti-inflammatories (NSAID): Secondary | ICD-10-CM

## 2020-12-28 DIAGNOSIS — Z885 Allergy status to narcotic agent status: Secondary | ICD-10-CM

## 2020-12-28 DIAGNOSIS — N400 Enlarged prostate without lower urinary tract symptoms: Secondary | ICD-10-CM | POA: Diagnosis present

## 2020-12-28 DIAGNOSIS — M4726 Other spondylosis with radiculopathy, lumbar region: Secondary | ICD-10-CM | POA: Diagnosis present

## 2020-12-28 DIAGNOSIS — K219 Gastro-esophageal reflux disease without esophagitis: Secondary | ICD-10-CM | POA: Diagnosis present

## 2020-12-28 DIAGNOSIS — Z20822 Contact with and (suspected) exposure to covid-19: Secondary | ICD-10-CM | POA: Diagnosis present

## 2020-12-28 DIAGNOSIS — M720 Palmar fascial fibromatosis [Dupuytren]: Secondary | ICD-10-CM | POA: Diagnosis present

## 2020-12-28 DIAGNOSIS — E119 Type 2 diabetes mellitus without complications: Secondary | ICD-10-CM | POA: Diagnosis present

## 2020-12-28 DIAGNOSIS — I10 Essential (primary) hypertension: Secondary | ICD-10-CM | POA: Diagnosis present

## 2020-12-28 DIAGNOSIS — M4316 Spondylolisthesis, lumbar region: Secondary | ICD-10-CM | POA: Diagnosis present

## 2020-12-28 DIAGNOSIS — Z96652 Presence of left artificial knee joint: Secondary | ICD-10-CM | POA: Diagnosis present

## 2020-12-28 DIAGNOSIS — M4306 Spondylolysis, lumbar region: Secondary | ICD-10-CM | POA: Diagnosis present

## 2020-12-28 DIAGNOSIS — Z419 Encounter for procedure for purposes other than remedying health state, unspecified: Secondary | ICD-10-CM

## 2020-12-28 HISTORY — PX: LUMBAR PERCUTANEOUS PEDICLE SCREW 3 LEVEL: SHX5562

## 2020-12-28 HISTORY — PX: APPLICATION OF ROBOTIC ASSISTANCE FOR SPINAL PROCEDURE: SHX6753

## 2020-12-28 HISTORY — PX: ANTERIOR LAT LUMBAR FUSION: SHX1168

## 2020-12-28 LAB — GLUCOSE, CAPILLARY
Glucose-Capillary: 140 mg/dL — ABNORMAL HIGH (ref 70–99)
Glucose-Capillary: 177 mg/dL — ABNORMAL HIGH (ref 70–99)
Glucose-Capillary: 186 mg/dL — ABNORMAL HIGH (ref 70–99)

## 2020-12-28 SURGERY — ANTERIOR LATERAL LUMBAR FUSION 3 LEVELS
Anesthesia: General

## 2020-12-28 MED ORDER — FENTANYL CITRATE (PF) 100 MCG/2ML IJ SOLN
INTRAMUSCULAR | Status: DC | PRN
  Administered 2020-12-28 (×5): 50 ug via INTRAVENOUS
  Administered 2020-12-28: 100 ug via INTRAVENOUS
  Administered 2020-12-28 (×2): 50 ug via INTRAVENOUS

## 2020-12-28 MED ORDER — 0.9 % SODIUM CHLORIDE (POUR BTL) OPTIME
TOPICAL | Status: DC | PRN
  Administered 2020-12-28: 1000 mL

## 2020-12-28 MED ORDER — CEFAZOLIN SODIUM-DEXTROSE 2-4 GM/100ML-% IV SOLN
2.0000 g | Freq: Three times a day (TID) | INTRAVENOUS | Status: AC
  Administered 2020-12-28 – 2020-12-29 (×2): 2 g via INTRAVENOUS
  Filled 2020-12-28 (×2): qty 100

## 2020-12-28 MED ORDER — MIDAZOLAM HCL 2 MG/2ML IJ SOLN
INTRAMUSCULAR | Status: AC
  Filled 2020-12-28: qty 2

## 2020-12-28 MED ORDER — INSULIN ASPART 100 UNIT/ML IJ SOLN
0.0000 [IU] | Freq: Three times a day (TID) | INTRAMUSCULAR | Status: DC
  Administered 2020-12-29: 3 [IU] via SUBCUTANEOUS

## 2020-12-28 MED ORDER — LACTATED RINGERS IV SOLN: INTRAVENOUS | Status: DC

## 2020-12-28 MED ORDER — GABAPENTIN 300 MG PO CAPS
300.0000 mg | ORAL_CAPSULE | Freq: Three times a day (TID) | ORAL | Status: DC
  Administered 2020-12-28: 300 mg via ORAL
  Filled 2020-12-28 (×2): qty 1

## 2020-12-28 MED ORDER — HYDROMORPHONE HCL 1 MG/ML IJ SOLN
0.2500 mg | INTRAMUSCULAR | Status: DC | PRN
  Administered 2020-12-28 (×3): 0.5 mg via INTRAVENOUS

## 2020-12-28 MED ORDER — HYDROXYZINE HCL 50 MG/ML IM SOLN: 50.0000 mg | Freq: Four times a day (QID) | INTRAMUSCULAR | Status: DC | PRN

## 2020-12-28 MED ORDER — SODIUM CHLORIDE 0.9 % IV SOLN: 250.0000 mL | INTRAVENOUS | Status: DC

## 2020-12-28 MED ORDER — CHLORHEXIDINE GLUCONATE CLOTH 2 % EX PADS: 6.0000 | MEDICATED_PAD | Freq: Once | CUTANEOUS | Status: DC

## 2020-12-28 MED ORDER — PROPOFOL 10 MG/ML IV BOLUS
INTRAVENOUS | Status: DC | PRN
  Administered 2020-12-28: 120 mg via INTRAVENOUS
  Administered 2020-12-28: 20 mg via INTRAVENOUS

## 2020-12-28 MED ORDER — THROMBIN 20000 UNITS EX KIT
PACK | CUTANEOUS | Status: AC
  Filled 2020-12-28: qty 1

## 2020-12-28 MED ORDER — ROCURONIUM BROMIDE 10 MG/ML (PF) SYRINGE
PREFILLED_SYRINGE | INTRAVENOUS | Status: AC
  Filled 2020-12-28: qty 10

## 2020-12-28 MED ORDER — ACETAMINOPHEN 500 MG PO TABS
1000.0000 mg | ORAL_TABLET | Freq: Once | ORAL | Status: AC
  Administered 2020-12-28: 1000 mg via ORAL
  Filled 2020-12-28: qty 2

## 2020-12-28 MED ORDER — PROPOFOL 10 MG/ML IV BOLUS
INTRAVENOUS | Status: AC
  Filled 2020-12-28: qty 20

## 2020-12-28 MED ORDER — THROMBIN 5000 UNITS EX SOLR
CUTANEOUS | Status: AC
  Filled 2020-12-28: qty 5000

## 2020-12-28 MED ORDER — PROPOFOL 500 MG/50ML IV EMUL
INTRAVENOUS | Status: DC | PRN
  Administered 2020-12-28: 50 ug/kg/min via INTRAVENOUS

## 2020-12-28 MED ORDER — LIDOCAINE-EPINEPHRINE 1 %-1:100000 IJ SOLN
INTRAMUSCULAR | Status: AC
  Filled 2020-12-28: qty 1

## 2020-12-28 MED ORDER — ONDANSETRON HCL 4 MG/2ML IJ SOLN
INTRAMUSCULAR | Status: DC | PRN
  Administered 2020-12-28: 4 mg via INTRAVENOUS

## 2020-12-28 MED ORDER — VITAMIN B-12 1000 MCG PO TABS
1000.0000 ug | ORAL_TABLET | Freq: Every day | ORAL | Status: DC
  Administered 2020-12-28: 1000 ug via ORAL
  Filled 2020-12-28 (×2): qty 1

## 2020-12-28 MED ORDER — MIDAZOLAM HCL 2 MG/2ML IJ SOLN
INTRAMUSCULAR | Status: DC | PRN
  Administered 2020-12-28: 2 mg via INTRAVENOUS

## 2020-12-28 MED ORDER — LIDOCAINE 2% (20 MG/ML) 5 ML SYRINGE
INTRAMUSCULAR | Status: DC | PRN
  Administered 2020-12-28: 60 mg via INTRAVENOUS

## 2020-12-28 MED ORDER — SENNA 8.6 MG PO TABS
1.0000 | ORAL_TABLET | Freq: Two times a day (BID) | ORAL | Status: DC
  Administered 2020-12-28: 8.6 mg via ORAL
  Filled 2020-12-28 (×2): qty 1

## 2020-12-28 MED ORDER — METHOCARBAMOL 1000 MG/10ML IJ SOLN
500.0000 mg | Freq: Four times a day (QID) | INTRAVENOUS | Status: DC | PRN
  Filled 2020-12-28: qty 5

## 2020-12-28 MED ORDER — BISACODYL 10 MG RE SUPP: 10.0000 mg | Freq: Every day | RECTAL | Status: DC | PRN

## 2020-12-28 MED ORDER — ACETAMINOPHEN 325 MG PO TABS: 650.0000 mg | ORAL_TABLET | ORAL | Status: DC | PRN

## 2020-12-28 MED ORDER — DEXAMETHASONE SODIUM PHOSPHATE 10 MG/ML IJ SOLN
INTRAMUSCULAR | Status: AC
  Filled 2020-12-28: qty 1

## 2020-12-28 MED ORDER — CYCLOSPORINE 0.05 % OP EMUL
1.0000 [drp] | Freq: Two times a day (BID) | OPHTHALMIC | Status: DC
  Administered 2020-12-28 – 2020-12-29 (×2): 1 [drp] via OPHTHALMIC
  Filled 2020-12-28 (×3): qty 30

## 2020-12-28 MED ORDER — BUPIVACAINE HCL (PF) 0.5 % IJ SOLN
INTRAMUSCULAR | Status: DC | PRN
  Administered 2020-12-28: 7.5 mL
  Administered 2020-12-28: 4 mL

## 2020-12-28 MED ORDER — LIDOCAINE 2% (20 MG/ML) 5 ML SYRINGE
INTRAMUSCULAR | Status: AC
  Filled 2020-12-28: qty 5

## 2020-12-28 MED ORDER — CEFAZOLIN SODIUM-DEXTROSE 2-4 GM/100ML-% IV SOLN
INTRAVENOUS | Status: AC
  Filled 2020-12-28: qty 100

## 2020-12-28 MED ORDER — SUCCINYLCHOLINE CHLORIDE 200 MG/10ML IV SOSY
PREFILLED_SYRINGE | INTRAVENOUS | Status: DC | PRN
Start: 2020-12-28 — End: 2020-12-28
  Administered 2020-12-28: 80 mg via INTRAVENOUS

## 2020-12-28 MED ORDER — SUGAMMADEX SODIUM 200 MG/2ML IV SOLN
INTRAVENOUS | Status: DC | PRN
  Administered 2020-12-28: 200 mg via INTRAVENOUS

## 2020-12-28 MED ORDER — ONDANSETRON HCL 4 MG/2ML IJ SOLN
4.0000 mg | Freq: Four times a day (QID) | INTRAMUSCULAR | Status: DC | PRN
  Administered 2020-12-28: 4 mg via INTRAVENOUS
  Filled 2020-12-28: qty 2

## 2020-12-28 MED ORDER — THROMBIN 5000 UNITS EX SOLR
OROMUCOSAL | Status: DC | PRN
  Administered 2020-12-28: 5 mL via TOPICAL

## 2020-12-28 MED ORDER — OXYCODONE HCL 5 MG PO TABS
10.0000 mg | ORAL_TABLET | ORAL | Status: DC | PRN
  Administered 2020-12-29: 10 mg via ORAL
  Filled 2020-12-28: qty 2

## 2020-12-28 MED ORDER — PROMETHAZINE HCL 25 MG/ML IJ SOLN
6.2500 mg | INTRAMUSCULAR | Status: DC | PRN
Start: 2020-12-28 — End: 2020-12-28
  Administered 2020-12-28: 6.25 mg via INTRAVENOUS

## 2020-12-28 MED ORDER — ATORVASTATIN CALCIUM 80 MG PO TABS
80.0000 mg | ORAL_TABLET | Freq: Every day | ORAL | Status: DC
  Administered 2020-12-28 – 2020-12-29 (×2): 80 mg via ORAL
  Filled 2020-12-28 (×2): qty 1

## 2020-12-28 MED ORDER — HYDROMORPHONE HCL 1 MG/ML IJ SOLN
INTRAMUSCULAR | Status: AC
  Filled 2020-12-28: qty 1

## 2020-12-28 MED ORDER — ONDANSETRON HCL 4 MG PO TABS: 4.0000 mg | ORAL_TABLET | Freq: Four times a day (QID) | ORAL | Status: DC | PRN

## 2020-12-28 MED ORDER — ONDANSETRON HCL 4 MG/2ML IJ SOLN
INTRAMUSCULAR | Status: AC
  Filled 2020-12-28: qty 2

## 2020-12-28 MED ORDER — THROMBIN 20000 UNITS EX SOLR
CUTANEOUS | Status: AC
  Filled 2020-12-28: qty 20000

## 2020-12-28 MED ORDER — ORAL CARE MOUTH RINSE: 15.0000 mL | Freq: Once | OROMUCOSAL | Status: AC

## 2020-12-28 MED ORDER — TAMSULOSIN HCL 0.4 MG PO CAPS
0.4000 mg | ORAL_CAPSULE | Freq: Every day | ORAL | Status: DC
  Administered 2020-12-28 – 2020-12-29 (×2): 0.4 mg via ORAL
  Filled 2020-12-28 (×2): qty 1

## 2020-12-28 MED ORDER — CEFAZOLIN SODIUM-DEXTROSE 2-4 GM/100ML-% IV SOLN
2.0000 g | INTRAVENOUS | Status: AC
  Administered 2020-12-28 (×2): 2 g via INTRAVENOUS
  Filled 2020-12-28: qty 100

## 2020-12-28 MED ORDER — SODIUM CHLORIDE 0.9 % IV SOLN: INTRAVENOUS | Status: DC

## 2020-12-28 MED ORDER — MORPHINE SULFATE (PF) 2 MG/ML IV SOLN: 2.0000 mg | INTRAVENOUS | Status: DC | PRN

## 2020-12-28 MED ORDER — DOCUSATE SODIUM 100 MG PO CAPS
100.0000 mg | ORAL_CAPSULE | Freq: Two times a day (BID) | ORAL | Status: DC
  Administered 2020-12-28 – 2020-12-29 (×2): 100 mg via ORAL
  Filled 2020-12-28 (×2): qty 1

## 2020-12-28 MED ORDER — METFORMIN HCL ER 500 MG PO TB24
500.0000 mg | ORAL_TABLET | Freq: Every day | ORAL | Status: DC
  Administered 2020-12-28 – 2020-12-29 (×2): 500 mg via ORAL
  Filled 2020-12-28 (×2): qty 1

## 2020-12-28 MED ORDER — LIDOCAINE-EPINEPHRINE 1 %-1:100000 IJ SOLN
INTRAMUSCULAR | Status: DC | PRN
  Administered 2020-12-28: 7.5 mL
  Administered 2020-12-28: 4 mL

## 2020-12-28 MED ORDER — PHENYLEPHRINE 40 MCG/ML (10ML) SYRINGE FOR IV PUSH (FOR BLOOD PRESSURE SUPPORT)
PREFILLED_SYRINGE | INTRAVENOUS | Status: AC
  Filled 2020-12-28: qty 10

## 2020-12-28 MED ORDER — ROCURONIUM BROMIDE 10 MG/ML (PF) SYRINGE
PREFILLED_SYRINGE | INTRAVENOUS | Status: DC | PRN
  Administered 2020-12-28 (×2): 20 mg via INTRAVENOUS
  Administered 2020-12-28: 40 mg via INTRAVENOUS

## 2020-12-28 MED ORDER — MEPERIDINE HCL 25 MG/ML IJ SOLN: 6.2500 mg | INTRAMUSCULAR | Status: DC | PRN

## 2020-12-28 MED ORDER — SODIUM CHLORIDE 0.9% FLUSH
3.0000 mL | Freq: Two times a day (BID) | INTRAVENOUS | Status: DC
  Administered 2020-12-28: 3 mL via INTRAVENOUS

## 2020-12-28 MED ORDER — OXYCODONE HCL 5 MG PO TABS
5.0000 mg | ORAL_TABLET | ORAL | Status: DC | PRN
  Administered 2020-12-28: 5 mg via ORAL
  Filled 2020-12-28: qty 1

## 2020-12-28 MED ORDER — ACETAMINOPHEN 650 MG RE SUPP: 650.0000 mg | RECTAL | Status: DC | PRN

## 2020-12-28 MED ORDER — HYDROXYZINE HCL 50 MG/ML IM SOLN
50.0000 mg | Freq: Four times a day (QID) | INTRAMUSCULAR | Status: DC | PRN
  Administered 2020-12-28 – 2020-12-29 (×2): 50 mg via INTRAMUSCULAR
  Filled 2020-12-28 (×2): qty 1

## 2020-12-28 MED ORDER — ALUM & MAG HYDROXIDE-SIMETH 200-200-20 MG/5ML PO SUSP
30.0000 mL | Freq: Four times a day (QID) | ORAL | Status: DC | PRN
  Administered 2020-12-28 – 2020-12-29 (×2): 30 mL via ORAL
  Filled 2020-12-28 (×2): qty 30

## 2020-12-28 MED ORDER — MENTHOL 3 MG MT LOZG: 1.0000 | LOZENGE | OROMUCOSAL | Status: DC | PRN

## 2020-12-28 MED ORDER — SODIUM CHLORIDE 0.9% FLUSH: 3.0000 mL | INTRAVENOUS | Status: DC | PRN

## 2020-12-28 MED ORDER — FENTANYL CITRATE (PF) 250 MCG/5ML IJ SOLN
INTRAMUSCULAR | Status: AC
  Filled 2020-12-28: qty 5

## 2020-12-28 MED ORDER — THROMBIN 20000 UNITS EX SOLR
CUTANEOUS | Status: DC | PRN
  Administered 2020-12-28: 20 mL via TOPICAL

## 2020-12-28 MED ORDER — VITAMIN D3 25 MCG (1000 UNIT) PO TABS
2000.0000 [IU] | ORAL_TABLET | Freq: Every day | ORAL | Status: DC
  Administered 2020-12-28: 2000 [IU] via ORAL
  Filled 2020-12-28 (×3): qty 2

## 2020-12-28 MED ORDER — PHENYLEPHRINE 40 MCG/ML (10ML) SYRINGE FOR IV PUSH (FOR BLOOD PRESSURE SUPPORT)
PREFILLED_SYRINGE | INTRAVENOUS | Status: DC | PRN
  Administered 2020-12-28: 80 ug via INTRAVENOUS
  Administered 2020-12-28 (×2): 40 ug via INTRAVENOUS
  Administered 2020-12-28 (×3): 80 ug via INTRAVENOUS

## 2020-12-28 MED ORDER — PHENOL 1.4 % MT LIQD: 1.0000 | OROMUCOSAL | Status: DC | PRN

## 2020-12-28 MED ORDER — SUCCINYLCHOLINE CHLORIDE 200 MG/10ML IV SOSY
PREFILLED_SYRINGE | INTRAVENOUS | Status: AC
  Filled 2020-12-28: qty 10

## 2020-12-28 MED ORDER — CHLORHEXIDINE GLUCONATE 0.12 % MT SOLN
15.0000 mL | Freq: Once | OROMUCOSAL | Status: AC
  Administered 2020-12-28: 15 mL via OROMUCOSAL
  Filled 2020-12-28: qty 15

## 2020-12-28 MED ORDER — PHENYLEPHRINE HCL-NACL 20-0.9 MG/250ML-% IV SOLN
INTRAVENOUS | Status: DC | PRN
  Administered 2020-12-28: 20 ug/min via INTRAVENOUS

## 2020-12-28 MED ORDER — PROMETHAZINE HCL 25 MG/ML IJ SOLN
INTRAMUSCULAR | Status: AC
  Filled 2020-12-28: qty 1

## 2020-12-28 MED ORDER — TIZANIDINE HCL 4 MG PO TABS
2.0000 mg | ORAL_TABLET | Freq: Three times a day (TID) | ORAL | Status: DC | PRN
  Administered 2020-12-29: 2 mg via ORAL
  Filled 2020-12-28: qty 1

## 2020-12-28 MED ORDER — METHOCARBAMOL 500 MG PO TABS: 500.0000 mg | ORAL_TABLET | Freq: Four times a day (QID) | ORAL | Status: DC | PRN

## 2020-12-28 MED ORDER — DEXAMETHASONE SODIUM PHOSPHATE 10 MG/ML IJ SOLN
INTRAMUSCULAR | Status: DC | PRN
  Administered 2020-12-28: 5 mg via INTRAVENOUS

## 2020-12-28 MED ORDER — PANTOPRAZOLE SODIUM 40 MG PO TBEC
80.0000 mg | DELAYED_RELEASE_TABLET | Freq: Every day | ORAL | Status: DC
  Administered 2020-12-28 – 2020-12-29 (×2): 80 mg via ORAL
  Filled 2020-12-28 (×2): qty 2

## 2020-12-28 MED ORDER — BUPIVACAINE HCL (PF) 0.5 % IJ SOLN
INTRAMUSCULAR | Status: AC
  Filled 2020-12-28: qty 30

## 2020-12-28 SURGICAL SUPPLY — 110 items
ADH SKN CLS APL DERMABOND .7 (GAUZE/BANDAGES/DRESSINGS) ×1
APL SKNCLS STERI-STRIP NONHPOA (GAUZE/BANDAGES/DRESSINGS)
BAG COUNTER SPONGE SURGICOUNT (BAG) ×4 IMPLANT
BAG SPNG CNTER NS LX DISP (BAG) ×2
BASKET BONE COLLECTION (BASKET) ×1 IMPLANT
BENZOIN TINCTURE PRP APPL 2/3 (GAUZE/BANDAGES/DRESSINGS) IMPLANT
BIT DRILL LONG 3.0X30 (BIT) ×1 IMPLANT
BIT DRILL LONG 3X80 (BIT) IMPLANT
BIT DRILL LONG 4X80 (BIT) IMPLANT
BIT DRILL SHORT 3.0X30 (BIT) IMPLANT
BIT DRILL SHORT 3X80 (BIT) IMPLANT
BLADE CLIPPER SURG (BLADE) ×1 IMPLANT
BLADE SURG 11 STRL SS (BLADE) ×3 IMPLANT
BUR MATCHSTICK NEURO 3.0 LAGG (BURR) ×2 IMPLANT
BUR PRECISION FLUTE 5.0 (BURR) ×2 IMPLANT
CANISTER SUCT 3000ML PPV (MISCELLANEOUS) ×2 IMPLANT
CAP PUSHER PTP (ORTHOPEDIC DISPOSABLE SUPPLIES) ×1 IMPLANT
CARTRIDGE OIL MAESTRO DRILL (MISCELLANEOUS) ×1 IMPLANT
CATH ROBINSON RED A/P 12FR (CATHETERS) IMPLANT
CLIP SPRING STIM LLIF SAFEOP (CLIP) ×1 IMPLANT
CNTNR URN SCR LID CUP LEK RST (MISCELLANEOUS) ×1 IMPLANT
CONT SPEC 4OZ STRL OR WHT (MISCELLANEOUS) ×2
COVER BACK TABLE 60X90IN (DRAPES) ×2 IMPLANT
DECANTER SPIKE VIAL GLASS SM (MISCELLANEOUS) ×2 IMPLANT
DERMABOND ADVANCED (GAUZE/BANDAGES/DRESSINGS) ×1
DERMABOND ADVANCED .7 DNX12 (GAUZE/BANDAGES/DRESSINGS) ×2 IMPLANT
DIFFUSER DRILL AIR PNEUMATIC (MISCELLANEOUS) ×2 IMPLANT
DILATOR INSULATED LLIF 8-13-18 (NEUROSURGERY SUPPLIES) ×1 IMPLANT
DISSECTOR BLUNT TIP ENDO 5MM (MISCELLANEOUS) ×1 IMPLANT
DRAPE C-ARM 42X72 X-RAY (DRAPES) ×5 IMPLANT
DRAPE C-ARMOR (DRAPES) ×4 IMPLANT
DRAPE LAPAROTOMY 100X72X124 (DRAPES) ×3 IMPLANT
DRAPE SHEET LG 3/4 BI-LAMINATE (DRAPES) ×2 IMPLANT
DRAPE SURG 17X23 STRL (DRAPES) ×2 IMPLANT
DRSG OPSITE POSTOP 4X6 (GAUZE/BANDAGES/DRESSINGS) ×1 IMPLANT
DRSG OPSITE POSTOP 4X8 (GAUZE/BANDAGES/DRESSINGS) ×1 IMPLANT
DURAPREP 26ML APPLICATOR (WOUND CARE) ×3 IMPLANT
ELECT KIT SAFEOP SSEP/SURF (KITS) ×2
ELECT REM PT RETURN 9FT ADLT (ELECTROSURGICAL) ×2
ELECTRODE KT SAFEOP SSEP/SURF (KITS) IMPLANT
ELECTRODE REM PT RTRN 9FT ADLT (ELECTROSURGICAL) ×2 IMPLANT
GAUZE 4X4 16PLY ~~LOC~~+RFID DBL (SPONGE) ×1 IMPLANT
GAUZE SPONGE 4X4 12PLY STRL (GAUZE/BANDAGES/DRESSINGS) IMPLANT
GLOVE EXAM NITRILE XL STR (GLOVE) IMPLANT
GLOVE SURG ENC MOIS LTX SZ7 (GLOVE) IMPLANT
GLOVE SURG LTX SZ7 (GLOVE) ×4 IMPLANT
GLOVE SURG UNDER POLY LF SZ7 (GLOVE) ×1 IMPLANT
GLOVE SURG UNDER POLY LF SZ7.5 (GLOVE) ×4 IMPLANT
GOWN STRL REUS W/ TWL LRG LVL3 (GOWN DISPOSABLE) ×2 IMPLANT
GOWN STRL REUS W/ TWL XL LVL3 (GOWN DISPOSABLE) ×2 IMPLANT
GOWN STRL REUS W/TWL 2XL LVL3 (GOWN DISPOSABLE) IMPLANT
GOWN STRL REUS W/TWL LRG LVL3 (GOWN DISPOSABLE) ×4
GOWN STRL REUS W/TWL XL LVL3 (GOWN DISPOSABLE) ×4
GRAFT BONE PROTEIOS LRG 5CC (Orthopedic Implant) ×1 IMPLANT
GRAFT BONE PROTEIOS XL 10CC (Orthopedic Implant) ×1 IMPLANT
GUIDEWIRE LLIF TT 310 (WIRE) ×1 IMPLANT
GUIDEWIRE TROC TIP NIT 25 (WIRE) ×8 IMPLANT
HEMOSTAT POWDER KIT SURGIFOAM (HEMOSTASIS) ×2 IMPLANT
KIT BASIN OR (CUSTOM PROCEDURE TRAY) ×4 IMPLANT
KIT SPINE MAZOR X ROBO DISP (MISCELLANEOUS) ×2 IMPLANT
KIT TURNOVER KIT B (KITS) ×3 IMPLANT
KNIFE ANNULOTOMY GREY RETRACT (ORTHOPEDIC DISPOSABLE SUPPLIES) ×1 IMPLANT
LIF ILLUMINATION SYSTEM STERIL (SYSTAGENIX WOUND MANAGEMENT) ×2
MODULE POSITIONING LIF 2.0 (INSTRUMENTS) ×1 IMPLANT
NDL HYPO 18GX1.5 BLUNT FILL (NEEDLE) IMPLANT
NDL HYPO 25X1 1.5 SAFETY (NEEDLE) ×1 IMPLANT
NDL SPNL 18GX3.5 QUINCKE PK (NEEDLE) ×1 IMPLANT
NEEDLE HYPO 18GX1.5 BLUNT FILL (NEEDLE) IMPLANT
NEEDLE HYPO 22GX1.5 SAFETY (NEEDLE) ×2 IMPLANT
NEEDLE HYPO 25X1 1.5 SAFETY (NEEDLE) ×2 IMPLANT
NEEDLE SPNL 18GX3.5 QUINCKE PK (NEEDLE) ×2 IMPLANT
NS IRRIG 1000ML POUR BTL (IV SOLUTION) ×3 IMPLANT
OIL CARTRIDGE MAESTRO DRILL (MISCELLANEOUS) ×2
PACK LAMINECTOMY NEURO (CUSTOM PROCEDURE TRAY) ×3 IMPLANT
PACK UNIVERSAL I (CUSTOM PROCEDURE TRAY) ×1 IMPLANT
PAD ARMBOARD 7.5X6 YLW CONV (MISCELLANEOUS) ×5 IMPLANT
PIN HEAD 2.5X60MM (PIN) IMPLANT
PROBE BALL TIP LLIF SAFEOP (NEUROSURGERY SUPPLIES) ×1 IMPLANT
PUTTY BONE 2.5CC (Putty) ×1 IMPLANT
PUTTY DBM 5CC (Putty) ×1 IMPLANT
ROD LORD MIS TI 5.5X100 (Rod) ×2 IMPLANT
SCREW CANN EXT 7.5X45 (Screw) ×2 IMPLANT
SCREW CANN MIS PA 6.5X45 (Screw) ×2 IMPLANT
SCREW CANN PA 6.5X50 (Screw) ×4 IMPLANT
SCREW SCHANZ SA 4.0MM (MISCELLANEOUS) IMPLANT
SET SCREW (Screw) ×16 IMPLANT
SET SCREW SPNE (Screw) IMPLANT
SHIM INTRADISCAL PTP NARROW (ORTHOPEDIC DISPOSABLE SUPPLIES) ×1 IMPLANT
SPACER LIF IDENTITI 8X22X55 10 (Spacer) ×1 IMPLANT
SPACER LIF IDENTITI 8X22X60 10 (Spacer) ×1 IMPLANT
SPACER LIF IDENTITI 8X22X60 15 (Spacer) ×1 IMPLANT
SPONGE SURGIFOAM ABS GEL 100 (HEMOSTASIS) ×2 IMPLANT
SPONGE SURGIFOAM ABS GEL SZ50 (HEMOSTASIS) IMPLANT
SPONGE T-LAP 4X18 ~~LOC~~+RFID (SPONGE) ×2 IMPLANT
SPONGE TONSIL TAPE 1 RFD (DISPOSABLE) IMPLANT
STAPLER VISISTAT 35W (STAPLE) ×2 IMPLANT
STRIP CLOSURE SKIN 1/2X4 (GAUZE/BANDAGES/DRESSINGS) IMPLANT
SUT VIC AB 0 CT1 18XCR BRD8 (SUTURE) ×2 IMPLANT
SUT VIC AB 0 CT1 8-18 (SUTURE) ×4
SUT VIC AB 2-0 CT1 18 (SUTURE) ×2 IMPLANT
SUT VIC AB 3-0 SH 8-18 (SUTURE) ×3 IMPLANT
SUT VICRYL 3-0 RB1 18 ABS (SUTURE) ×2 IMPLANT
SYR 3ML LL SCALE MARK (SYRINGE) IMPLANT
SYSTEM ILLUMINATION LIF STERIL (SYSTAGENIX WOUND MANAGEMENT) IMPLANT
TOWEL GREEN STERILE (TOWEL DISPOSABLE) ×3 IMPLANT
TOWEL GREEN STERILE FF (TOWEL DISPOSABLE) ×3 IMPLANT
TRAP SPECIMEN MUCUS 40CC (MISCELLANEOUS) ×1 IMPLANT
TRAY FOLEY MTR SLVR 16FR STAT (SET/KITS/TRAYS/PACK) ×3 IMPLANT
TUBE MAZOR SA REDUCTION (TUBING) ×2 IMPLANT
WATER STERILE IRR 1000ML POUR (IV SOLUTION) ×3 IMPLANT

## 2020-12-28 NOTE — Anesthesia Procedure Notes (Signed)
Arterial Line Insertion Start/End11/18/2022 7:20 AM, 12/28/2020 7:30 AM Performed by: Shireen Quan, CRNA, CRNA  Patient location: Pre-op. Preanesthetic checklist: patient identified, IV checked, site marked, risks and benefits discussed, surgical consent, monitors and equipment checked, pre-op evaluation, timeout performed and anesthesia consent Lidocaine 1% used for infiltration Catheter size: 20 G Hand hygiene performed , maximum sterile barriers used  and Seldinger technique used  Attempts: 1 Procedure performed without using ultrasound guided technique. Following insertion, dressing applied and Biopatch. Post procedure assessment: normal

## 2020-12-28 NOTE — Transfer of Care (Signed)
Immediate Anesthesia Transfer of Care Note  Patient: Nicholas Quinn  Procedure(s) Performed: PRONE TRANSPSOAS INTERBODY FUSION LUMBAR TWO-THREE,LUMBAR THREE-FOUR,LUMBAR FOUR-FIVE PERCUTANEOUS POSTERIOR SEGMENTAL INSTRUMENTATION LUMBAR TWO-FIVE APPLICATION OF ROBOTIC ASSISTANCE FOR SPINAL PROCEDURE  Patient Location: PACU  Anesthesia Type:General  Level of Consciousness: drowsy and patient cooperative  Airway & Oxygen Therapy: Patient Spontanous Breathing and Patient connected to nasal cannula oxygen  Post-op Assessment: Report given to RN, Post -op Vital signs reviewed and stable and Patient moving all extremities  Post vital signs: Reviewed and stable  Last Vitals:  Vitals Value Taken Time  BP 132/64 12/28/20 1608  Temp    Pulse 95 12/28/20 1609  Resp 16 12/28/20 1609  SpO2 99 % 12/28/20 1609  Vitals shown include unvalidated device data.  Last Pain:  Vitals:   12/28/20 0548  TempSrc:   PainSc: 10-Worst pain ever      Patients Stated Pain Goal: 0 (12/28/20 0548)  Complications: No notable events documented.

## 2020-12-28 NOTE — Op Note (Signed)
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, L2-3, L3-4, L4-5 Spondylolisthesis, L2-3, L3-4 Degenerative Scoliosis   POSTOP DIAGNOSIS: Same  PROCEDURE STAGE 1: L3-4 laminectomy with bilateral radical facetectomy for decompression of thecal sac and L3 and L4 nerve roots L2-L5 posterior segmental instrumentation - [employer] 6.5 x 75mm screws @ L2-L3, 6.5 x 45 @ L4, 7.5 x 45 @ L5 Use of intraoperative robotic assistance  PROCEDURE STAGE 2: Left Transpsoas approach for lateral interbody fusion, L2-3, L3-4, L4-5 Placement of intervertebral biomechanical cages - Alphatec IdentiTi LIF [employer]  L2-3: 12(ant) 8(pos) 22x60 10 deg  L3-4: 13(ant) 8(pos) 22x60 15 deg  L4-5: 12(ant) 8(pos) 22x55 10 deg Anterior Interbody arthrodesis, L2-3, L3-4, L4-5 Use of morcellized bone autograft Use of non-structural bone allograft - DBM, ProteiOS Use of Intraoperative electrophysiologic monitoring  SURGEON: Dr. Consuella Lose, MD  ASSISTANT: Dr. Elwin Sleight, MD  ANESTHESIA: General Endotracheal  EBL: 200cc  SPECIMENS: None  DRAINS: None  COMPLICATIONS: None immediate  CONDITION: Hemodynamically stable to PACU  HISTORY: Nicholas Quinn is a 75 y.o. male initially seen in the outpatient neurosurgery clinic with relatively chronic low back and bilateral leg pain.  His imaging did reveal significant lumbar spondylosis with severe stenosis worst at L2-3, L3-4, and L4-5.  He did have retrolisthesis of degenerative nature at L2-3 and L3-4 as well as a primarily coronal dextroconvex scoliosis.  Patient did attempt multiple different conservative treatments with progression of his symptoms.  He ultimately elected to proceed with surgical decompression and fusion.  The risks, benefits, and alternatives to surgery were all reviewed in detail with the patient.  After all his questions were answered informed consent was obtained  and witnessed.  PROCEDURE IN DETAIL: The patient was brought to the operating room. After induction of general anesthesia, the patient was positioned on the operative table in the prone position. All pressure points were meticulously padded.  Electrodes were placed for intraoperative SSEP and EMG monitoring.  Good signals were noted with monitorable SSEPs.  Lateral fluoroscopy was used to identify the projection of the L2-3, L3-4, and L4-5 disc spaces on the left flank.  Oblique incision was then marked out to access these disc spaces.  Midline skin incision was then marked out and the entire region was prepped and draped in the usual sterile fashion.  After timeout was conducted, Schanz pin was introduced into the right posterior superior iliac spine.  The Mazor robot was then connected and AP and lateral fluoroscopic images were taken to register with the preoperative stereotactic CT scan.  Excellent accuracy was achieved.  The Mazor robot was then used to mark out the surface projection of the L2 and L5 pedicle screws bilaterally.  Midline skin incision was then infiltrated with local anesthetic with epinephrine, large enough to allow access to the pedicle screws at L2-L5.  Incision was then made sharply and carried down through the subcutaneous tissue until the lumbodorsal fascia was identified.  The skin was then undermined.  At this point the Mazor robot was used to drill pilot holes into the pedicles at L2, L3, L4, and L5 bilaterally.  AP and lateral fluoroscopic images were then taken with K wires in place, with the K wires noted to be in excellent position within the pedicles at all levels bilaterally.  At this point attention was turned to decompression.  The fascia was opened in the midline, and the Bovie electrocautery was used to dissect in the subperiosteal  plane.  A dissector was then placed in the L3-4 interspace and our location was confirmed with lateral fluoroscopy.  Self-retaining  retractors were then placed.  Rongeurs were used to remove the spinous process at L3.  High-speed drill was then used to complete a laminectomy at L3.  High-speed drill was again used to cut across the pars interarticularis bilaterally at L3 to remove the inferior articulating process.  The ligamentum flavum was noted to be significantly thickened.  There was also a significant amount of arthropathy in the facet joints bilaterally which did appear to be compressing the thecal sac.  Kerrison punches were then used to remove the remainder of the L3-4 ligamentum flavum and the medial and superior aspect of the superior articulating process of L4.  Thecal sac then appeared to be well decompressed.  Bone removed during the decompression was morselized and set aside for interbody fusion.  I was also able to identify the traversing L4 nerve roots bilaterally.  Ligament was then removed from the L3-4 foramen bilaterally in order to decompress the L3 nerve roots. Having completed the decompression, the muscle and fascia was then closed with interrupted 0 Vicryl stitches after good hemostasis was achieved using bipolar electrocautery and morselized Gelfoam with thrombin.  Attention was then turned to the lateral portion of the procedure.  The left flank incision was made sharply and carried down through the subcutaneous tissue.  The oblique musculature was divided bluntly, and the transversalis was punctured to enter the retroperitoneum.  The retroperitoneal space was then bluntly dissected with my finger.  I was then able to identify the transverse processes of L2, L3, L4, and L5.  Initially, attention was turned to the L2-3 disc space.  Dilator was placed over the psoas muscle at the L2-3 disc space.  The dilator was then stimulated and stimulation threshold was reasonably high indicating good margin away from the lumbar plexus.  Sequential dilators were then placed using live EMG stimulation and the 120 mm retractor was  placed.  This was then fixated to the arm attached to the table.  AP and lateral fluoroscopic images were taken and confirmed good location of the retractor with access to the L2-3 disc space.  Direct stimulation was then performed again with relatively high stimulation threshold.  The retractor was then opened.  The disc space was then incised and using a series of shavers, curettes, and rongeurs, the discectomy was completed.  Trial implants were then used until the above trial was selected.  The cage was then packed with the previously harvested autograft and mixed with DBM and ProteiOS.  The cage was then inserted under AP fluoroscopy.  Once this cage was seated, it was checked with lateral fluoroscopy.  Retractor was then removed under direct vision with no active bleeding noted.  In a similar fashion, the L3-4 disc space was identified and sequential dilators were placed under live EMG stimulation again noting relatively high stimulation thresholds indicating some distance from the lumbar plexus.  Another 120 mm retractor was then placed and position was checked with fluoroscopy.  After direct stimulation, the retractor was opened.  The disc space was then incised and again using a combination of curettes, shavers, and rongeurs the discectomy was completed.  Trials were then sequentially placed in the above cage was selected.  This was again filled with bone autograft, allograft, and proteios.  The cage was then tapped into place under AP fluoroscopy and checked with lateral fluoroscopy and noted to be in  good position.  The retractor was then removed again under direct vision with no active bleeding noted.  Finally, attention was turned to the L4-5 disc space.  The initial dilator was placed over the L4-5 interspace and advanced through the psoas with direct EMG stimulation.  Relatively high stimulation threshold was noted.  The remainder of the sequential dilators were then placed and a retractor was  placed.  After direct stimulation with high thresholds, the retractor was opened.  Disc base was then incised and again using a combination of curettes, rongeurs, and shavers, the discectomy was completed.  Series of trials were then used and the above cage was selected.  This was then filled with bone autograft, allograft, and ProteiOS.  The cage was then inserted.  Lateral fluoroscopy noted the cage to be placed with the taller end of the cage more posteriorly.  I therefore remove the cage and reinserted with the correct orientation to preserve lordosis.  Position was again checked with AP and lateral fluoroscopy and noted to be in good position at the anterior margin of the L4-5 disc space.  The retractor was then removed under direct vision with no active bleeding noted.  The left flank incision was then closed in multiple layers using a combination of interrupted 0 Vicryl stitches.  Attention was then turned back to the midline incision.  The above screws were sequentially placed over the previously placed K wires.  AP and lateral fluoroscopic images were taken after placement of the screws and noted to be in excellent position within the pedicles at L2-L5 bilaterally.  100 mm rod was then sized and passed from L2-L5.  Setscrews were then placed and final tightened.  Reduction towers were then removed.  Final AP and lateral fluoroscopic images were taken of the entire construct which appeared to be in excellent position.  There remains good lumbar lordosis, and the rotational and coronal scoliosis appeared to be significantly improved.  At this point the midline incision was irrigated with normal saline.  The fascial incisions were closed with interrupted 0 Vicryl stitches.  The deep subcutaneous layer was closed with interrupted 0 Vicryl stitches tacking down to the underlying fascia.  Skin was then closed with staples.  The left flank incision was also closed with staples.  The Schanz pin was removed  and this incision was closed with staples.  Bacitracin ointment and sterile dressing was applied.  At the end of the case all sponge, needle, instrument, and cottonoid counts were correct.  Patient was then extubated and taken to the postanesthesia care unit in stable hemodynamic condition.   Lisbeth Renshaw, MD [facility]

## 2020-12-28 NOTE — H&P (Signed)
Chief Complaint  Back and leg pain  History of Present Illness  Nicholas Quinn is a 75 y.o. male initially seen in the outpatient neurosurgery clinic for low back and bilateral leg pain.  Symptoms started several months ago without any identifiable inciting event.  Patient describes pain across his lower back with radiation through bilateral lower extremities.  He does get some burning type pain in both legs and paresthesias, worsened with walking.  No changes in bladder function.  Patient does report exacerbation of pain when bending or twisting.  Patient has attempted multiple different conservative treatments including anti-inflammatory medications, and chiropractic manipulations.  Past Medical History   Past Medical History:  Diagnosis Date   B12 deficiency    Benign prostate hyperplasia    Diabetes mellitus without complication (HCC)    Dupuytren's fibromatosis    Elevated alkaline phosphatase level    GERD (gastroesophageal reflux disease)    Hyperlipidemia    Hypertension    Palpitations    PONV (postoperative nausea and vomiting)    Premature atrial contractions    Vitamin D deficiency     Past Surgical History   Past Surgical History:  Procedure Laterality Date   CHOLECYSTECTOMY     INGUINAL HERNIA REPAIR Left    1983   REVISION TOTAL KNEE ARTHROPLASTY Left    2012   SKIN LESION EXCISION     2009   TOTAL KNEE ARTHROPLASTY Left    2006    Social History   Social History   Tobacco Use   Smoking status: Former    Types: Cigarettes   Smokeless tobacco: Never  Vaping Use   Vaping Use: Never used  Substance Use Topics   Alcohol use: Not Currently   Drug use: Never    Medications   Prior to Admission medications   Medication Sig Start Date End Date Taking? Authorizing Provider  acetaminophen (TYLENOL) 500 MG tablet Take 1,000 mg by mouth every 6 (six) hours as needed for moderate pain.   Yes [provider]  atorvastatin (LIPITOR) 80 MG  tablet Take 80 mg by mouth daily. 10/19/20  Yes [provider]  Cholecalciferol (VITAMIN D) 50 MCG (2000 UT) tablet Take 2,000 Units by mouth daily.   Yes [provider]  gabapentin (NEURONTIN) 300 MG capsule Take 300 mg by mouth 3 (three) times daily. 11/28/20  Yes [provider]  meloxicam (MOBIC) 7.5 MG tablet Take 7.5 mg by mouth daily.   Yes [provider]  metFORMIN (GLUCOPHAGE-XR) 500 MG 24 hr tablet Take 500 mg by mouth daily. 10/19/20  Yes [provider]  omeprazole (PRILOSEC) 40 MG capsule Take 40 mg by mouth daily. 10/19/20  Yes [provider]  RESTASIS 0.05 % ophthalmic emulsion Place 1 drop into both eyes 2 (two) times daily. 10/19/20  Yes [provider]  tiZANidine (ZANAFLEX) 2 MG tablet Take 2 mg by mouth every 8 (eight) hours as needed for muscle spasms.   Yes [provider]  vitamin B-12 (CYANOCOBALAMIN) 1000 MCG tablet Take 1,000 mcg by mouth daily.   Yes [provider]    Allergies   Allergies  Allergen Reactions   Codeine Nausea And Vomiting   Other Nausea And Vomiting    General anesthesia     Review of Systems  ROS  Neurologic Exam  Awake, alert, oriented Memory and concentration grossly intact Speech fluent, appropriate CN grossly intact Motor exam: Upper Extremities Deltoid Bicep Tricep Grip  Right 5/5 5/5 5/5  5/5  Left 5/5 5/5 5/5 5/5   Lower Extremities IP Quad PF DF EHL  Right 5/5 5/5 5/5 5/5 5/5  Left 5/5 5/5 5/5 5/5 5/5   Sensation grossly intact to LT  Imaging  MRI of the lumbar spine dated 11/02/2020 was personally reviewed and demonstrates maintenance of lumbar lordosis with a partial dextroconvex coronal deformity and a rotational deformity.  There is degenerative retrolisthesis of L2 on L3 and L3 on L4.  There is also significant disc desiccation and loss of height at these levels.  There is relatively severe stenosis at L3-4 due to ligamentous hypertrophy and  facet arthropathy as well as broad-based disc bulge.  Impression  - 75 y.o. 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  This will consist of Tran psoas interbody fusion at L2-3 L3-4 and L4-5 with direct decompression via laminotomy and likely facetectomy at L3-4 and placement of posterior segmental instrumentation L2-L5  I have reviewed the imaging findings with the patient and his family at length in the office.  We have discussed treatment options including conservative treatment versus proceeding with surgical decompression and fusion.  We discussed the risks, benefits, and alternatives to surgery as well as the expected postoperative course and recovery.  All his questions today were answered.  He provided informed consent to proceed.   Lisbeth Renshaw, MD [facility]

## 2020-12-28 NOTE — Anesthesia Procedure Notes (Signed)
Procedure Name: Intubation Date/Time: 12/28/2020 9:00 AM Performed by: Moshe Salisbury, CRNA Pre-anesthesia Checklist: Patient identified, Emergency Drugs available, Suction available and Patient being monitored Patient Re-evaluated:Patient Re-evaluated prior to induction Oxygen Delivery Method: Circle System Utilized Preoxygenation: Pre-oxygenation with 100% oxygen Induction Type: IV induction Ventilation: Mask ventilation without difficulty Laryngoscope Size: Mac and 4 Grade View: Grade I Tube type: Oral Tube size: 8.0 mm Number of attempts: 1 Airway Equipment and Method: Stylet Placement Confirmation: ETT inserted through vocal cords under direct vision, positive ETCO2 and breath sounds checked- equal and bilateral Secured at: 22 cm Tube secured with: Tape Dental Injury: Teeth and Oropharynx as per pre-operative assessment

## 2020-12-28 NOTE — Progress Notes (Signed)
Orthopedic Tech Progress Note Patient Details:  Nicholas Quinn Jun 28, 1945 [id number]  Ortho Devices Type of Ortho Device: Lumbar corsett Ortho Device/Splint Location: Back Ortho Device/Splint Interventions: Ordered   Post Interventions Patient Tolerated: Other (comment) Instructions Provided: Other (comment)  Michelle Piper 12/28/2020, 7:37 PM

## 2020-12-29 LAB — BASIC METABOLIC PANEL
Anion gap: 7 (ref 5–15)
BUN: 13 mg/dL (ref 8–23)
CO2: 26 mmol/L (ref 22–32)
Calcium: 8.6 mg/dL — ABNORMAL LOW (ref 8.9–10.3)
Chloride: 100 mmol/L (ref 98–111)
Creatinine, Ser: 0.81 mg/dL (ref 0.61–1.24)
GFR, Estimated: >60 mL/min (ref >60)
Glucose, Bld: 236 mg/dL — ABNORMAL HIGH (ref 70–99)
Potassium: 4.1 mmol/L (ref 3.5–5.1)
Sodium: 133 mmol/L — ABNORMAL LOW (ref 135–145)

## 2020-12-29 LAB — CBC
HCT: 34.1 % — ABNORMAL LOW (ref 39.0–52.0)
Hemoglobin: 11.8 g/dL — ABNORMAL LOW (ref 13.0–17.0)
MCH: 31.1 pg (ref 26.0–34.0)
MCHC: 34.6 g/dL (ref 30.0–36.0)
MCV: 89.7 fL (ref 80.0–100.0)
Platelets: 145 10*3/uL — ABNORMAL LOW (ref 150–400)
RBC: 3.8 MIL/uL — ABNORMAL LOW (ref 4.22–5.81)
RDW: 13.9 % (ref 11.5–15.5)
WBC: 11 10*3/uL — ABNORMAL HIGH (ref 4.0–10.5)
nRBC: 0 % (ref 0.0–0.2)

## 2020-12-29 LAB — GLUCOSE, CAPILLARY: Glucose-Capillary: 225 mg/dL — ABNORMAL HIGH (ref 70–99)

## 2020-12-29 LAB — HEMOGLOBIN A1C
Hgb A1c MFr Bld: 6.2 % — ABNORMAL HIGH (ref 4.8–5.6)
Mean Plasma Glucose: 131.24 mg/dL

## 2020-12-29 MED ORDER — TIZANIDINE HCL 2 MG PO TABS: 2.0000 mg | ORAL_TABLET | Freq: Four times a day (QID) | ORAL | 3 refills | Status: AC | PRN

## 2020-12-29 MED ORDER — TRAMADOL HCL 50 MG PO TABS: 50.0000 mg | ORAL_TABLET | Freq: Four times a day (QID) | ORAL | 0 refills | Status: AC | PRN

## 2020-12-29 NOTE — Discharge Instructions (Signed)

## 2020-12-29 NOTE — Plan of Care (Signed)
  Problem: Education: Goal: Ability to verbalize activity precautions or restrictions will improve Outcome: Completed/Met Goal: Knowledge of the prescribed therapeutic regimen will improve Outcome: Completed/Met Goal: Understanding of discharge needs will improve Outcome: Completed/Met   Problem: Activity: Goal: Ability to avoid complications of mobility impairment will improve Outcome: Completed/Met Goal: Ability to tolerate increased activity will improve Outcome: Completed/Met Goal: Will remain free from falls Outcome: Completed/Met   Problem: Bowel/Gastric: Goal: Gastrointestinal status for postoperative course will improve Outcome: Completed/Met   Problem: Clinical Measurements: Goal: Ability to maintain clinical measurements within normal limits will improve Outcome: Completed/Met Goal: Postoperative complications will be avoided or minimized Outcome: Completed/Met Goal: Diagnostic test results will improve Outcome: Completed/Met   Problem: Pain Management: Goal: Pain level will decrease Outcome: Completed/Met   Problem: Skin Integrity: Goal: Will show signs of wound healing Outcome: Completed/Met   Problem: Health Behavior/Discharge Planning: Goal: Identification of resources available to assist in meeting health care needs will improve Outcome: Completed/Met   Problem: Bladder/Genitourinary: Goal: Urinary functional status for postoperative course will improve Outcome: Completed/Met   Problem: Safety: Goal: Ability to remain free from injury will improve Outcome: Completed/Met   Problem: Education: Goal: Ability to verbalize activity precautions or restrictions will improve Outcome: Completed/Met Goal: Knowledge of the prescribed therapeutic regimen will improve Outcome: Completed/Met Goal: Understanding of discharge needs will improve Outcome: Completed/Met   Problem: Activity: Goal: Ability to avoid complications of mobility impairment will  improve Outcome: Completed/Met Goal: Ability to tolerate increased activity will improve Outcome: Completed/Met Goal: Will remain free from falls Outcome: Completed/Met   Problem: Bowel/Gastric: Goal: Gastrointestinal status for postoperative course will improve Outcome: Completed/Met   Problem: Clinical Measurements: Goal: Ability to maintain clinical measurements within normal limits will improve Outcome: Completed/Met Goal: Postoperative complications will be avoided or minimized Outcome: Completed/Met Goal: Diagnostic test results will improve Outcome: Completed/Met   Problem: Pain Management: Goal: Pain level will decrease Outcome: Completed/Met   Problem: Skin Integrity: Goal: Will show signs of wound healing Outcome: Completed/Met   Problem: Health Behavior/Discharge Planning: Goal: Identification of resources available to assist in meeting health care needs will improve Outcome: Completed/Met   Problem: Bladder/Genitourinary: Goal: Urinary functional status for postoperative course will improve Outcome: Completed/Met

## 2020-12-29 NOTE — Evaluation (Signed)
Occupational Therapy Evaluation Patient Details Name: Nicholas Quinn MRN: [id number] DOB: Oct 05, 1945 Today's Date: 12/29/2020   History of Present Illness 75 y.o. male presents to [facility] hospital on 12/28/2020 with low back pain radiating to BLE. 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 Pt underwent L3-4 lami with decompression, L2-5 posterior instrumentation, L2-5 lateral interbody fusion, and L2-5 anterior interbody arthrosdesis on 12/28/2020. PMH includes BPH, DM, HLD, HTN.   Clinical Impression   Patient admitted for the procedure above.  PTA he lives alone in a senior style apartment.  He needed no assist with ADL/IADL per the patient, continues to drive, and did not use an AD for mobility.  Decreased safety and poor recall/memory are the primary deficits.  Fortunately he is going to be staying with family for a few weeks prior to returning home.  HH OT is recommended for continued reinforcement of back precautions during functional tasks.        Recommendations for follow up therapy are one component of a multi-disciplinary discharge planning process, led by the attending physician.  Recommendations may be updated based on patient status, additional functional criteria and insurance authorization.   Follow Up Recommendations  Home health OT    Assistance Recommended at Discharge Frequent or constant Supervision/Assistance  Functional Status Assessment  Patient has had a recent decline in their functional status and demonstrates the ability to make significant improvements in function in a reasonable and predictable amount of time.  Equipment Recommendations  Tub/shower seat;Other (comment) (hip kit)    Recommendations for Other Services       Precautions / Restrictions Precautions Precautions: Fall;Back Precaution Booklet  Issued: Yes (comment) Precaution Comments: pt with poor recall of back precautions despite PT verbal cues and demonstration, no recall by end of session Required Braces or Orthoses: Spinal Brace Spinal Brace: Lumbar corset;Applied in sitting position Restrictions Weight Bearing Restrictions: No      Mobility Bed Mobility Overal bed mobility: Needs Assistance Bed Mobility: Rolling;Sidelying to Sit Rolling: Supervision Sidelying to sit: Supervision       General bed mobility comments: sitting EOB    Transfers Overall transfer level: Needs assistance Equipment used: Rolling walker (2 wheels) Transfers: Sit to/from Stand Sit to Stand: Supervision           General transfer comment: verbal cues for hand placement      Balance Overall balance assessment: Needs assistance Sitting-balance support: No upper extremity supported;Feet supported Sitting balance-Leahy Scale: Good     Standing balance support: Reliant on assistive device for balance Standing balance-Leahy Scale: Fair Standing balance comment: able to static stand at sink, but decreased safety is a concern                           ADL either performed or assessed with clinical judgement   ADL       Grooming: Wash/dry hands;Wash/dry face;Supervision/safety;Standing       Lower Body Bathing: Minimal assistance;Sit to/from stand;Moderate assistance       Lower Body Dressing: Moderate assistance;Sit to/from stand;Minimal assistance Lower Body Dressing Details (indicate cue type and reason): difficulty reaching B feet.               General ADL Comments: Demonstrated Helena reacher for pant management and LH Sponge to wash below knees  Vision Patient Visual Report: No change from baseline       Perception Perception Perception: Not tested   Praxis Praxis Praxis: Not tested    Pertinent Vitals/Pain Pain Assessment: No/denies pain     Hand Dominance Right   Extremity/Trunk  Assessment Upper Extremity Assessment Upper Extremity Assessment: Overall WFL for tasks assessed   Lower Extremity Assessment Lower Extremity Assessment: Defer to PT evaluation   Cervical / Trunk Assessment Cervical / Trunk Assessment: Kyphotic;Back Surgery   Communication Communication Communication: Other (comment)   Cognition Arousal/Alertness: Awake/alert Behavior During Therapy: WFL for tasks assessed/performed Overall Cognitive Status: No family/caregiver present to determine baseline cognitive functioning                                 General Comments: Decreased memory, slow processing with decreased complex thought.     General Comments  VSS on RA    Exercises     Shoulder Instructions      Home Living Family/patient expects to be discharged to:: Private residence Living Arrangements: Other relatives Available Help at Discharge: Family;Available 24 hours/day Type of Home: House Home Access: Stairs to enter [employer] of Steps: 2 Entrance Stairs-Rails:  (one rail) Home Layout: One level     Bathroom Shower/Tub: Tub/shower unit;Walk-in shower   Bathroom Toilet: Standard     Home Equipment: None          Prior Functioning/Environment Prior Level of Function : Independent/Modified Independent             Mobility Comments: pt reports ambulating independently, working in maintenance at [employer] ADLs Comments: Stated he did not need any assist with bathing dressing.  His apartment is equiped with grabbars in the shower and near the toilet.  He does not have a shower chair        OT Problem List: Decreased safety awareness;Impaired balance (sitting and/or standing)      OT Treatment/Interventions: Self-care/ADL training;Therapeutic activities;Cognitive remediation/compensation    OT Goals(Current goals can be found in the care plan section) Acute Rehab OT Goals Patient Stated Goal: unsure OT Goal Formulation: Patient  unable to participate in goal setting Time For Goal Achievement: 01/12/21 Potential to Achieve Goals: Good ADL Goals Pt Will Perform Grooming: with modified independence;standing Pt Will Perform Lower Body Bathing: with modified independence;sit to/from stand Pt Will Perform Lower Body Dressing: with modified independence;sit to/from stand  OT Frequency: Min 2X/week   Barriers to D/C:    none noted - PT contacted the family and he will be staying with his niece for 2 weeks.       Co-evaluation              AM-PAC OT "6 Clicks" Daily Activity     Outcome Measure Help from another person eating meals?: None Help from another person taking care of personal grooming?: None Help from another person toileting, which includes using toliet, bedpan, or urinal?: None Help from another person bathing (including washing, rinsing, drying)?: A Little Help from another person to put on and taking off regular upper body clothing?: None Help from another person to put on and taking off regular lower body clothing?: A Little 6 Click Score: 22   End of Session Equipment Utilized During Treatment: Rolling walker (2 wheels)  Activity Tolerance: Patient tolerated treatment well Patient left: in chair;with call bell/phone within reach  OT Visit Diagnosis: Unsteadiness on feet (R26.81)  Time: 7939-0300 OT Time Calculation (min): 20 min Charges:  OT General Charges $OT Visit: 1 Visit OT Evaluation $OT Eval Moderate Complexity: 1 Mod  12/29/2020  RP, OTR/L  Acute Rehabilitation Services  Office:  [phone]   Metta Clines 12/29/2020, 9:54 AM

## 2020-12-29 NOTE — Progress Notes (Signed)
Patient awaiting transport to his vehicle via wheelchair by NT for discharge home; in no acute distress nor complaints of pain nor discomfort; incision on his back and left flank with honeycomb dressing and is clean, dry and intact; room was checked and accounted for all his belongings; discharge instructions concerning his medications, incision care, follow up appointment and when to call the doctor as needed were all discussed with patient's niece Marcelino Duster by RN and she expressed understanding on the instructions given.

## 2020-12-29 NOTE — TOC Transition Note (Signed)
Transition of Care [facility]) - CM/SW Discharge Note   Patient Details  Name: Nicholas Quinn MRN: [id number] Date of Birth: [birth year]  Transition of Care [facility]) CM/SW Contact:  Lawerance Sabal, RN Phone Number: 12/29/2020, 10:45 AM   Clinical Narrative:    Unable to secure [facility] services. Spoke w nurse. Family states that they will be taking him home with them to [facility] for recovery from surgery. Family works at [employer] and feels comfortable following HEP instructions provided by PT OT.  Unit staff to supply DME needed.           Patient Goals and CMS Choice        Discharge Placement                       Discharge Plan and Services                                     Social Determinants of Health (SDOH) Interventions     Readmission Risk Interventions No flowsheet data found.

## 2020-12-29 NOTE — Discharge Summary (Signed)
Physician Discharge Summary  Patient ID: Nicholas Quinn MRN: [id number] DOB/AGE: Apr 06, 1945 75 y.o.  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  Neurogenic claudication.  Lumbar radiculopathy.  Degenerative scoliosis.  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  Neurogenic claudication.  Lumbar radiculopathy.  Degenerative scoliosis. Principal Problem:   Spondylolysis of lumbar region   Discharged Condition: fair  Hospital Course: Patient was admitted to undergo surgical decompression stabilization from L2-L5.  Tolerated surgery well.  Consults: None  Significant Diagnostic Studies: None  Treatments: surgery: See op note  Discharge Exam: Blood pressure (!) 98/57, pulse 92, temperature 98.1 F (36.7 C), temperature source Oral, resp. rate 16, height 5\' 10"  (1.778 m), weight 65.8 kg, SpO2 98 %. Incision is clean and dry Station and gait are intact  Disposition: Discharge disposition: 01-Home or Self Care       Discharge Instructions     Call MD for:  redness, tenderness, or signs of infection (pain, swelling, redness, odor or green/yellow discharge around incision site)   Complete by: As directed    Call MD for:  severe uncontrolled pain   Complete by: As directed    Call MD for:  temperature >100.4   Complete by: As directed    Diet - low sodium heart healthy   Complete by: As directed    Discharge wound care:   Complete by: As directed    Per Dr. order   Increase activity slowly   Complete by: As directed       Allergies as of 12/29/2020       Reactions   Codeine Nausea And Vomiting   Other Nausea And Vomiting   General anesthesia         Medication List     TAKE these medications    acetaminophen 500 MG tablet Commonly known as: TYLENOL Take 1,000 mg by mouth every 6 (six) hours as needed for moderate pain.   atorvastatin 80  MG tablet Commonly known as: LIPITOR Take 80 mg by mouth daily.   gabapentin 300 MG capsule Commonly known as: NEURONTIN Take 300 mg by mouth 3 (three) times daily.   meloxicam 7.5 MG tablet Commonly known as: MOBIC Take 7.5 mg by mouth daily.   metFORMIN 500 MG 24 hr tablet Commonly known as: GLUCOPHAGE-XR Take 500 mg by mouth daily.   omeprazole 40 MG capsule Commonly known as: PRILOSEC Take 40 mg by mouth daily.   Restasis 0.05 % ophthalmic emulsion Generic drug: cycloSPORINE Place 1 drop into both eyes 2 (two) times daily.   tiZANidine 2 MG tablet Commonly known as: ZANAFLEX Take 1 tablet (2 mg total) by mouth every 6 (six) hours as needed for muscle spasms. What changed: when to take this   traMADol 50 MG tablet Commonly known as: Ultram Take 1 tablet (50 mg total) by mouth every 6 (six) hours as needed for moderate pain or severe pain.   vitamin B-12 1000 MCG tablet Commonly known as: CYANOCOBALAMIN Take 1,000 mcg by mouth daily.   Vitamin D 50 MCG (2000 UT) tablet Take 2,000 Units by mouth daily.               Durable Medical Equipment  (From admission, onward)           Start     Ordered   12/29/20 0907  For home use only DME Walker rolling  Once  Question Answer Comment  Walker: With 5 Inch Wheels   Patient needs a walker to treat with the following condition S/P lumbar spinal fusion      12/29/20 0906              Discharge Care Instructions  (From admission, onward)           Start     Ordered   12/29/20 0000  Discharge wound care:       Comments: Per Dr. Conchita Paris order   12/29/20 0954             Signed: [name] 12/29/2020, 9:54 AM

## 2020-12-29 NOTE — Evaluation (Signed)
Physical Therapy Evaluation Patient Details Name: Nicholas Quinn MRN: [id number] DOB: 1945-03-23 Today's Date: 12/29/2020  History of Present Illness  75 y.o. male presents to [facility] hospital on 12/28/2020 with low back pain radiating to BLE. 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 Pt underwent L3-4 lami with decompression, L2-5 posterior instrumentation, L2-5 lateral interbody fusion, and L2-5 anterior interbody arthrosdesis on 12/28/2020. PMH includes BPH, DM, HLD, HTN.  Clinical Impression  Pt presents to PT with deficits in gait, balance, power, endurance, functional mobility, cognition. Pt mobilizes without physical assistance, benefiting from UE support of walker for stability. Pt demonstrates impaired recall of back precautions and requires assistance for brace use during session. Pt also with some impaired safety awareness, backward walking out of bathroom back to bed despite PT cues to stop backward walking and turn to face direction the pt is headed. PT contacts pt's niece who reports the pt will have frequent supervision at home. Pt's niece works in outpatient orthopedics and reports comfort managing brace. PT provides pt with written handout of back precautions for family reference. PT recommends HHPT in an effort to provide continued reinforcement of precautions and to aide in a progression to the patient's mobility baseline.     Recommendations for follow up therapy are one component of a multi-disciplinary discharge planning process, led by the attending physician.  Recommendations may be updated based on patient status, additional functional criteria and insurance authorization.  Follow Up Recommendations Home health PT    Assistance Recommended at Discharge Frequent or constant Supervision/Assistance  Functional Status  Assessment Patient has had a recent decline in their functional status and demonstrates the ability to make significant improvements in function in a reasonable and predictable amount of time.  Equipment Recommendations  Rolling walker (2 wheels)    Recommendations for Other Services       Precautions / Restrictions Precautions Precautions: Fall;Back Precaution Booklet Issued: Yes (comment) Precaution Comments: pt with poor recall of back precautions despite PT verbal cues and demonstration, no recall by end of session Required Braces or Orthoses: Spinal Brace Spinal Brace: Lumbar corset;Applied in sitting position Restrictions Weight Bearing Restrictions: No      Mobility  Bed Mobility Overal bed mobility: Needs Assistance Bed Mobility: Rolling;Sidelying to Sit Rolling: Supervision Sidelying to sit: Supervision       General bed mobility comments: use of rail    Transfers Overall transfer level: Needs assistance Equipment used: Rolling walker (2 wheels) Transfers: Sit to/from Stand Sit to Stand: Supervision           General transfer comment: verbal cues for hand placement    Ambulation/Gait Ambulation/Gait assistance: Supervision Gait Distance (Feet): 80 Feet Assistive device: Rolling walker (2 wheels) Gait Pattern/deviations: Step-through pattern Gait velocity: reduced Gait velocity interpretation: <1.8 ft/sec, indicate of risk for recurrent falls   General Gait Details: pt with slowed step-through gait, PT cues to maintain BOS within walker and to avoid kyphotic posture when ambulating. PT cues pt to stop backward walking after leaving bathroom to improve safety, however pt continues to back up ~10 more feet to bed rather than turning and walking forward per PT instruction  Stairs            Wheelchair Mobility    Modified Rankin (Stroke Patients Only)       Balance Overall balance assessment: Needs  assistance Sitting-balance support: No upper  extremity supported;Feet supported Sitting balance-Leahy Scale: Good     Standing balance support: Reliant on assistive device for balance Standing balance-Leahy Scale: Poor                               Pertinent Vitals/Pain Pain Assessment: No/denies pain    Home Living Family/patient expects to be discharged to:: Private residence Living Arrangements: Other relatives (niece and niece's family) Available Help at Discharge: Family;Available 24 hours/day Type of Home: House Home Access: Stairs to enter Entrance Stairs-Rails:  (one rail) Secretary/administrator of Steps: 2   Home Layout: One level Home Equipment: None      Prior Function Prior Level of Function : Independent/Modified Independent             Mobility Comments: pt reports ambulating independently, working in maintenance at a [employer]       [employer]        Extremity/Trunk Assessment   Upper Extremity Assessment Upper Extremity Assessment: Defer to OT evaluation    Lower Extremity Assessment Lower Extremity Assessment: Generalized weakness    Cervical / Trunk Assessment Cervical / Trunk Assessment: Kyphotic;Back Surgery  Communication   Communication: Other (comment) (dysarthric)  Cognition Arousal/Alertness: Awake/alert Behavior During Therapy: WFL for tasks assessed/performed Overall Cognitive Status: No family/caregiver present to determine baseline cognitive functioning                                 General Comments: pt is alert and oriented to person, place, situation. Pt follows most one-step commands, once or twice during session fails to follow. Pt with limited awareness of back precautions and with poor short term memory. No caregiver present during session to provide comparison to baseline        General Comments General comments (skin integrity, edema, etc.): VSS on RA, pt does often report feeling drowsy    Exercises     Assessment/Plan    PT  Assessment Patient needs continued PT services  PT Problem List Decreased strength;Decreased activity tolerance;Decreased balance;Decreased mobility;Decreased cognition;Decreased knowledge of use of DME;Decreased safety awareness;Decreased knowledge of precautions       PT Treatment Interventions DME instruction;Gait training;Stair training;Functional mobility training;Therapeutic activities;Therapeutic exercise;Balance training;Patient/family education    PT Goals (Current goals can be found in the Care Plan section)  Acute Rehab PT Goals Patient Stated Goal: to go home PT Goal Formulation: With patient Time For Goal Achievement: 01/03/21 Potential to Achieve Goals: Good    Frequency Min 5X/week   Barriers to discharge        Co-evaluation               AM-PAC PT "6 Clicks" Mobility  Outcome Measure Help needed turning from your back to your side while in a flat bed without using bedrails?: A Little Help needed moving from lying on your back to sitting on the side of a flat bed without using bedrails?: A Little Help needed moving to and from a bed to a chair (including a wheelchair)?: A Little Help needed standing up from a chair using your arms (e.g., wheelchair or bedside chair)?: A Little Help needed to walk in hospital room?: A Little Help needed climbing 3-5 steps with a railing? : A Lot 6 Click Score: 17    End of Session Equipment Utilized During Treatment: Back brace Activity Tolerance:  Patient tolerated treatment well Patient left: in bed;with call bell/phone within reach Nurse Communication: Mobility status PT Visit Diagnosis: Other abnormalities of gait and mobility (R26.89)    Time: 9811-9147 PT Time Calculation (min) (ACUTE ONLY): 35 min   Charges:   PT Evaluation $PT Eval Low Complexity: 1 Low          Arlyss Gandy, PT, DPT Acute Rehabilitation Pager: [phone] Office [phone]   Arlyss Gandy 12/29/2020, 9:17 AM

## 2020-12-30 NOTE — Anesthesia Postprocedure Evaluation (Signed)
Anesthesia Post Note  Patient: Nicholas Quinn  Procedure(s) Performed: PRONE TRANSPSOAS INTERBODY FUSION LUMBAR TWO-THREE,LUMBAR THREE-FOUR,LUMBAR FOUR-FIVE PERCUTANEOUS POSTERIOR SEGMENTAL INSTRUMENTATION LUMBAR TWO-FIVE APPLICATION OF ROBOTIC ASSISTANCE FOR SPINAL PROCEDURE     Patient location during evaluation: PACU Anesthesia Type: General Level of consciousness: sedated and patient cooperative Pain management: pain level controlled Vital Signs Assessment: post-procedure vital signs reviewed and stable Respiratory status: spontaneous breathing Cardiovascular status: stable Anesthetic complications: no   No notable events documented.  Last Vitals:  Vitals:   12/29/20 0519 12/29/20 0738  BP: 132/74 (!) 98/57  Pulse: (!) 114 92  Resp:  16  Temp: 36.9 C 36.7 C  SpO2: 98% 98%    Last Pain:  Vitals:   12/29/20 0738  TempSrc: Oral  PainSc: Asleep                 Lewie Loron

## 2021-01-02 ENCOUNTER — Encounter (HOSPITAL_COMMUNITY): Payer: Self-pay | Admitting: Neurosurgery

## 2022-02-08 IMAGING — RF DG LUMBAR SPINE 2-3V
1 series · 2 of 2 positions shown · non-contrast
Comparison: CT lumbar spine 12/27/2020

CLINICAL DATA: Lumbar fusion L2 through L5.

EXAM:
LUMBAR SPINE - 2-3 VIEW; DG C-ARM 1-60 MIN-NO REPORT

[Series 1: run · 2 of 2 slices shown]
[im 1/2]
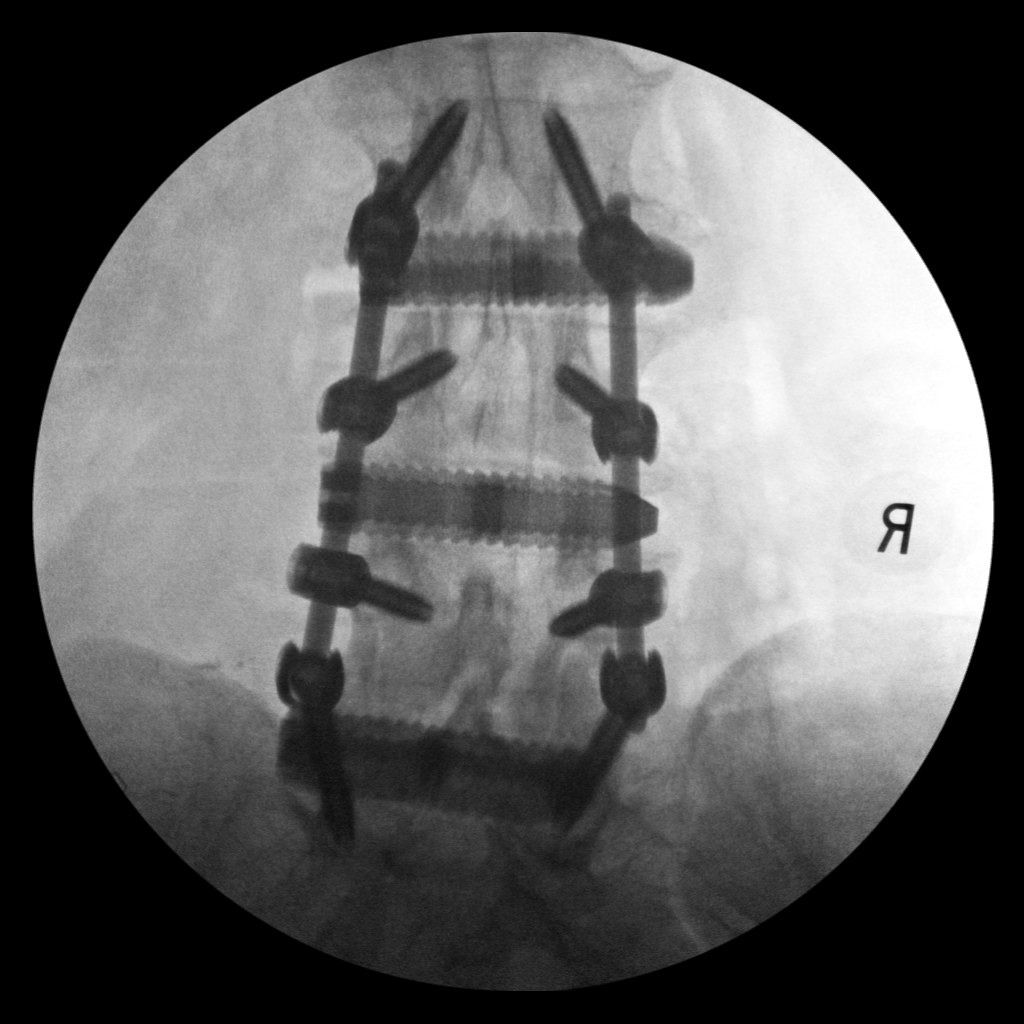
[im 2/2]
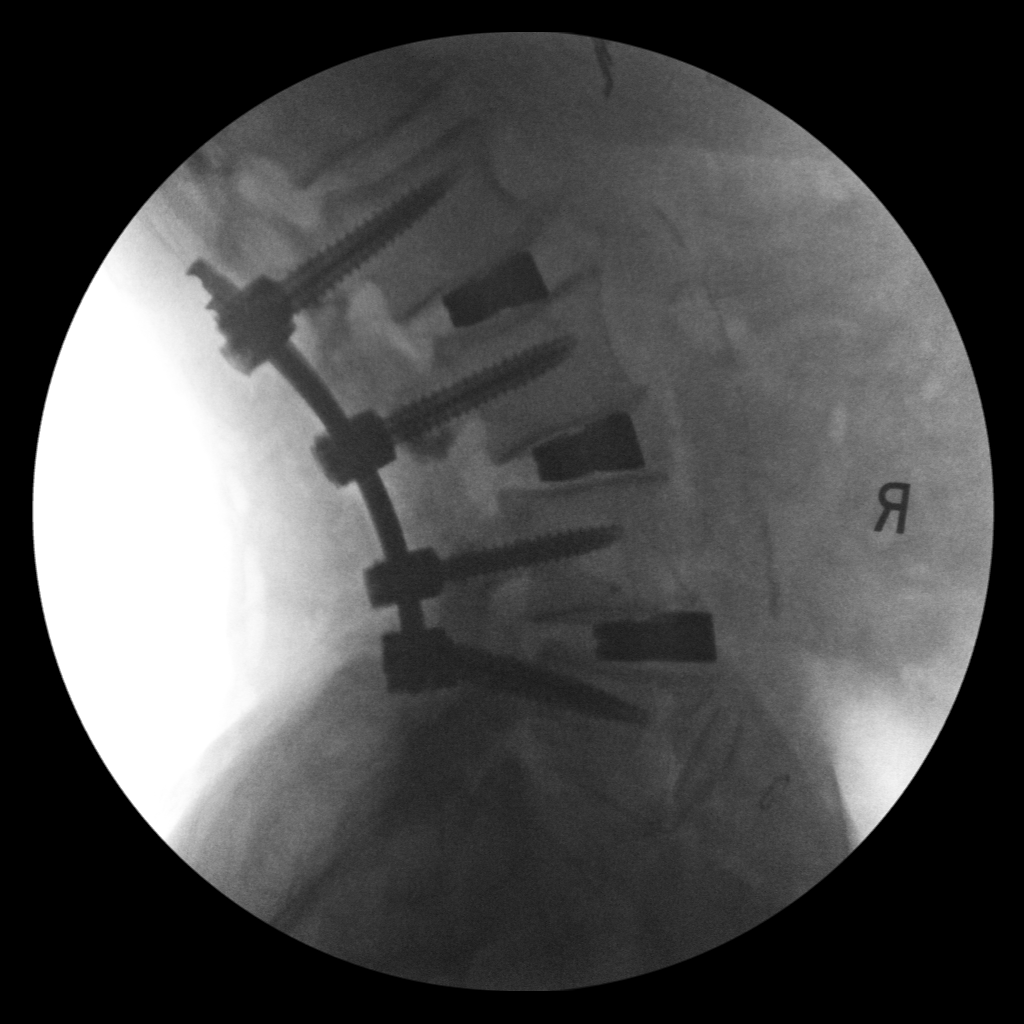

[2 of 2 positions shown; findings below may reference images not displayed]

FINDINGS: AP and lateral C-arm images were obtained.

Bilateral pedicle screw and posterior rod fusion L2 through L5.
Interbody spacers L2-3, L3-4, L4-5. Negative for fracture.
IMPRESSION: PLIF L2 through L5.
